# Patient Record
Sex: Female | Born: 1950
Health system: Southern US, Community
[De-identification: ages and names within clinical notes are randomized; demographics above are authoritative.]

## PROBLEM LIST (undated history)

## (undated) DIAGNOSIS — J302 Other seasonal allergic rhinitis: Secondary | ICD-10-CM

## (undated) DIAGNOSIS — M810 Age-related osteoporosis without current pathological fracture: Secondary | ICD-10-CM

## (undated) DIAGNOSIS — E785 Hyperlipidemia, unspecified: Secondary | ICD-10-CM

## (undated) DIAGNOSIS — N390 Urinary tract infection, site not specified: Secondary | ICD-10-CM

## (undated) DIAGNOSIS — I82409 Acute embolism and thrombosis of unspecified deep veins of unspecified lower extremity: Secondary | ICD-10-CM

## (undated) DIAGNOSIS — Z86718 Personal history of other venous thrombosis and embolism: Secondary | ICD-10-CM

## (undated) DIAGNOSIS — B019 Varicella without complication: Secondary | ICD-10-CM

## (undated) DIAGNOSIS — K219 Gastro-esophageal reflux disease without esophagitis: Secondary | ICD-10-CM

## (undated) DIAGNOSIS — M8580 Other specified disorders of bone density and structure, unspecified site: Secondary | ICD-10-CM

## (undated) DIAGNOSIS — I10 Essential (primary) hypertension: Secondary | ICD-10-CM

## (undated) DIAGNOSIS — E039 Hypothyroidism, unspecified: Secondary | ICD-10-CM

## (undated) HISTORY — DX: Hyperlipidemia, unspecified: E78.5

## (undated) HISTORY — DX: Varicella without complication: B01.9

## (undated) HISTORY — DX: Other seasonal allergic rhinitis: J30.2

## (undated) HISTORY — DX: Hypothyroidism, unspecified: E03.9

## (undated) HISTORY — DX: Age-related osteoporosis without current pathological fracture: M81.0

## (undated) HISTORY — DX: Urinary tract infection, site not specified: N39.0

## (undated) HISTORY — DX: Other specified disorders of bone density and structure, unspecified site: M85.80

## (undated) HISTORY — PX: COLONOSCOPY: SHX174

## (undated) HISTORY — DX: Personal history of other venous thrombosis and embolism: Z86.718

## (undated) HISTORY — DX: Gastro-esophageal reflux disease without esophagitis: K21.9

## (undated) HISTORY — PX: OTHER SURGICAL HISTORY: SHX169

## (undated) HISTORY — DX: Essential (primary) hypertension: I10

## (undated) HISTORY — DX: Acute embolism and thrombosis of unspecified deep veins of unspecified lower extremity: I82.409

---

## 1999-05-26 ENCOUNTER — Other Ambulatory Visit: Admission: RE | Admit: 1999-05-26 | Discharge: 1999-05-26 | Payer: Self-pay | Admitting: Gynecology

## 1999-05-26 ENCOUNTER — Encounter: Payer: Self-pay | Admitting: Gynecology

## 1999-05-26 ENCOUNTER — Encounter: Admission: RE | Admit: 1999-05-26 | Discharge: 1999-05-26 | Payer: Self-pay | Admitting: Gynecology

## 2000-06-08 ENCOUNTER — Encounter: Admission: RE | Admit: 2000-06-08 | Discharge: 2000-06-08 | Payer: Self-pay | Admitting: Gynecology

## 2000-06-08 ENCOUNTER — Encounter: Payer: Self-pay | Admitting: Gynecology

## 2000-07-12 ENCOUNTER — Other Ambulatory Visit: Admission: RE | Admit: 2000-07-12 | Discharge: 2000-07-12 | Payer: Self-pay | Admitting: Gynecology

## 2001-08-03 ENCOUNTER — Other Ambulatory Visit: Admission: RE | Admit: 2001-08-03 | Discharge: 2001-08-03 | Payer: Self-pay | Admitting: Gynecology

## 2001-12-04 ENCOUNTER — Encounter: Admission: RE | Admit: 2001-12-04 | Discharge: 2001-12-04 | Payer: Self-pay | Admitting: Gynecology

## 2001-12-04 ENCOUNTER — Encounter: Payer: Self-pay | Admitting: Gynecology

## 2003-12-12 ENCOUNTER — Encounter (INDEPENDENT_AMBULATORY_CARE_PROVIDER_SITE_OTHER): Payer: Self-pay | Admitting: *Deleted

## 2003-12-12 ENCOUNTER — Ambulatory Visit (HOSPITAL_COMMUNITY): Admission: RE | Admit: 2003-12-12 | Discharge: 2003-12-12 | Payer: Self-pay | Admitting: *Deleted

## 2003-12-16 ENCOUNTER — Other Ambulatory Visit: Admission: RE | Admit: 2003-12-16 | Discharge: 2003-12-16 | Payer: Self-pay | Admitting: Gynecology

## 2003-12-17 ENCOUNTER — Encounter: Admission: RE | Admit: 2003-12-17 | Discharge: 2003-12-17 | Payer: Self-pay | Admitting: Endocrinology

## 2004-12-29 ENCOUNTER — Encounter: Admission: RE | Admit: 2004-12-29 | Discharge: 2004-12-29 | Payer: Self-pay | Admitting: Family Medicine

## 2005-04-14 ENCOUNTER — Other Ambulatory Visit: Admission: RE | Admit: 2005-04-14 | Discharge: 2005-04-14 | Payer: Self-pay | Admitting: Gynecology

## 2006-01-06 ENCOUNTER — Encounter: Admission: RE | Admit: 2006-01-06 | Discharge: 2006-01-06 | Payer: Self-pay | Admitting: Family Medicine

## 2006-01-25 ENCOUNTER — Encounter: Admission: RE | Admit: 2006-01-25 | Discharge: 2006-01-25 | Payer: Self-pay | Admitting: Family Medicine

## 2006-05-11 ENCOUNTER — Other Ambulatory Visit: Admission: RE | Admit: 2006-05-11 | Discharge: 2006-05-11 | Payer: Self-pay | Admitting: Gynecology

## 2007-03-02 HISTORY — PX: BREAST BIOPSY: SHX20

## 2007-03-10 ENCOUNTER — Encounter: Admission: RE | Admit: 2007-03-10 | Discharge: 2007-03-10 | Payer: Self-pay | Admitting: Family Medicine

## 2007-03-15 ENCOUNTER — Encounter: Admission: RE | Admit: 2007-03-15 | Discharge: 2007-03-15 | Payer: Self-pay | Admitting: Family Medicine

## 2007-03-22 ENCOUNTER — Encounter: Admission: RE | Admit: 2007-03-22 | Discharge: 2007-03-22 | Payer: Self-pay | Admitting: Family Medicine

## 2007-05-22 ENCOUNTER — Encounter: Admission: RE | Admit: 2007-05-22 | Discharge: 2007-05-22 | Payer: Self-pay | Admitting: General Surgery

## 2007-05-24 ENCOUNTER — Ambulatory Visit (HOSPITAL_BASED_OUTPATIENT_CLINIC_OR_DEPARTMENT_OTHER): Admission: RE | Admit: 2007-05-24 | Discharge: 2007-05-24 | Payer: Self-pay | Admitting: General Surgery

## 2007-05-24 ENCOUNTER — Encounter (INDEPENDENT_AMBULATORY_CARE_PROVIDER_SITE_OTHER): Payer: Self-pay | Admitting: General Surgery

## 2007-05-24 ENCOUNTER — Encounter: Admission: RE | Admit: 2007-05-24 | Discharge: 2007-05-24 | Payer: Self-pay | Admitting: General Surgery

## 2007-05-24 HISTORY — PX: EXCISION OF BREAST BIOPSY: SHX5822

## 2008-06-18 ENCOUNTER — Encounter: Admission: RE | Admit: 2008-06-18 | Discharge: 2008-06-18 | Payer: Self-pay | Admitting: General Surgery

## 2008-12-14 ENCOUNTER — Encounter (INDEPENDENT_AMBULATORY_CARE_PROVIDER_SITE_OTHER): Payer: Self-pay | Admitting: *Deleted

## 2008-12-14 ENCOUNTER — Emergency Department (HOSPITAL_COMMUNITY): Admission: EM | Admit: 2008-12-14 | Discharge: 2008-12-14 | Payer: Self-pay | Admitting: Emergency Medicine

## 2008-12-19 ENCOUNTER — Encounter: Admission: RE | Admit: 2008-12-19 | Discharge: 2008-12-19 | Payer: Self-pay | Admitting: Otolaryngology

## 2009-03-14 ENCOUNTER — Ambulatory Visit: Payer: Self-pay | Admitting: Gastroenterology

## 2009-03-14 DIAGNOSIS — K219 Gastro-esophageal reflux disease without esophagitis: Secondary | ICD-10-CM | POA: Insufficient documentation

## 2009-03-14 LAB — CONVERTED CEMR LAB
Alcohol, Ethyl (B): <10 mg/dL (ref 0–10)
Eosinophils Relative: 0.5 % (ref 0.0–5.0)
Lymphocytes Relative: 24 % (ref 12.0–46.0)
Lymphs Abs: 2.3 10*3/uL (ref 0.7–4.0)
MCV: 97.7 fL (ref 78.0–100.0)
Monocytes Relative: 9.2 % (ref 3.0–12.0)
Neutrophils Relative %: 65.8 % (ref 43.0–77.0)

## 2009-03-17 LAB — CONVERTED CEMR LAB
ALT: 18 units/L (ref 0–35)
AST: 27 units/L (ref 0–37)
Albumin: 4.4 g/dL (ref 3.5–5.2)
Alkaline Phosphatase: 64 units/L (ref 39–117)
CO2: 21 meq/L (ref 19–32)
Chloride: 100 meq/L (ref 96–112)
Total Bilirubin: 0.3 mg/dL (ref 0.3–1.2)

## 2009-03-18 ENCOUNTER — Telehealth: Payer: Self-pay | Admitting: Gastroenterology

## 2009-03-27 ENCOUNTER — Ambulatory Visit (HOSPITAL_COMMUNITY): Admission: RE | Admit: 2009-03-27 | Discharge: 2009-03-27 | Payer: Self-pay | Admitting: Gastroenterology

## 2009-03-27 ENCOUNTER — Ambulatory Visit: Payer: Self-pay | Admitting: Gastroenterology

## 2009-07-17 ENCOUNTER — Encounter: Payer: Self-pay | Admitting: Nurse Practitioner

## 2009-07-31 ENCOUNTER — Telehealth: Payer: Self-pay | Admitting: Gastroenterology

## 2009-08-04 ENCOUNTER — Ambulatory Visit: Payer: Self-pay | Admitting: Internal Medicine

## 2009-08-04 DIAGNOSIS — R1011 Right upper quadrant pain: Secondary | ICD-10-CM

## 2009-08-04 DIAGNOSIS — R143 Flatulence: Secondary | ICD-10-CM

## 2009-08-04 DIAGNOSIS — R142 Eructation: Secondary | ICD-10-CM

## 2009-08-04 DIAGNOSIS — R1084 Generalized abdominal pain: Secondary | ICD-10-CM | POA: Insufficient documentation

## 2009-08-04 DIAGNOSIS — R110 Nausea: Secondary | ICD-10-CM

## 2009-08-04 DIAGNOSIS — R141 Gas pain: Secondary | ICD-10-CM | POA: Insufficient documentation

## 2009-08-12 ENCOUNTER — Ambulatory Visit (HOSPITAL_COMMUNITY): Admission: RE | Admit: 2009-08-12 | Discharge: 2009-08-12 | Payer: Self-pay | Admitting: Internal Medicine

## 2009-08-26 ENCOUNTER — Ambulatory Visit: Payer: Self-pay | Admitting: Gastroenterology

## 2009-08-27 ENCOUNTER — Ambulatory Visit: Payer: Self-pay | Admitting: Cardiology

## 2009-08-27 LAB — CONVERTED CEMR LAB
AST: 20 units/L (ref 0–37)
Amylase: 96 units/L (ref 27–131)
BUN: 15 mg/dL (ref 6–23)
CO2: 31 meq/L (ref 19–32)
Chloride: 105 meq/L (ref 96–112)
GFR calc non Af Amer: 98.95 mL/min (ref >60)
Lipase: 27 units/L (ref 11.0–59.0)
Potassium: 4.5 meq/L (ref 3.5–5.1)
Total Bilirubin: 0.4 mg/dL (ref 0.3–1.2)
Total Protein: 7.2 g/dL (ref 6.0–8.3)

## 2009-08-29 ENCOUNTER — Telehealth: Payer: Self-pay | Admitting: Gastroenterology

## 2009-08-29 ENCOUNTER — Encounter (INDEPENDENT_AMBULATORY_CARE_PROVIDER_SITE_OTHER): Payer: Self-pay | Admitting: *Deleted

## 2009-08-29 DIAGNOSIS — R933 Abnormal findings on diagnostic imaging of other parts of digestive tract: Secondary | ICD-10-CM

## 2009-09-04 ENCOUNTER — Ambulatory Visit: Payer: Self-pay | Admitting: Gastroenterology

## 2009-09-04 ENCOUNTER — Ambulatory Visit (HOSPITAL_COMMUNITY): Admission: RE | Admit: 2009-09-04 | Discharge: 2009-09-04 | Payer: Self-pay | Admitting: Gastroenterology

## 2009-09-10 ENCOUNTER — Telehealth: Payer: Self-pay | Admitting: Gastroenterology

## 2009-10-07 ENCOUNTER — Ambulatory Visit: Payer: Self-pay | Admitting: Gastroenterology

## 2009-10-07 ENCOUNTER — Encounter (INDEPENDENT_AMBULATORY_CARE_PROVIDER_SITE_OTHER): Payer: Self-pay | Admitting: *Deleted

## 2009-10-29 ENCOUNTER — Encounter: Payer: Self-pay | Admitting: Gastroenterology

## 2009-10-29 ENCOUNTER — Ambulatory Visit: Payer: Self-pay | Admitting: Gastroenterology

## 2009-12-19 ENCOUNTER — Telehealth: Payer: Self-pay | Admitting: Gastroenterology

## 2010-01-01 ENCOUNTER — Telehealth: Payer: Self-pay | Admitting: Gastroenterology

## 2010-01-02 ENCOUNTER — Encounter: Payer: Self-pay | Admitting: Gastroenterology

## 2010-02-04 ENCOUNTER — Encounter: Payer: Self-pay | Admitting: Gastroenterology

## 2010-02-06 ENCOUNTER — Ambulatory Visit: Payer: Self-pay | Admitting: Gastroenterology

## 2010-03-21 ENCOUNTER — Encounter: Payer: Self-pay | Admitting: Gynecology

## 2010-03-22 ENCOUNTER — Encounter: Payer: Self-pay | Admitting: Gynecology

## 2010-04-02 NOTE — Procedures (Signed)
Summary: Colon   Colonoscopy  Procedure date:  12/12/2003  Findings:      Location:  [facility].   NAME:  [name], STREAM                ACCOUNT NO.:  [id number]   MEDICAL RECORD NO.:  [id number]          PATIENT TYPE:  AMB   LOCATION:  ENDO                         FACILITY:  [facility]   PHYSICIAN:  Georgiana Spinner, M.D.    DATE OF BIRTH:  1951-02-12   DATE OF PROCEDURE:  12/12/2003  DATE OF DISCHARGE:                                 OPERATIVE REPORT   PROCEDURE PERFORMED:  Colonoscopy.   ENDOSCOPIST:  Georgiana Spinner, M.D.   INDICATIONS FOR PROCEDURE:  Colon cancer screening.   ANESTHESIA:  Demerol 30 mg, Versed 3 mg.   DESCRIPTION OF PROCEDURE:  With the patient mildly sedated in the left  lateral decubitus position, the Olympus videoscopic colonoscope was inserted  in the rectum and passed under direct vision to the cecum, identified by the  ileocecal valve and appendiceal orifice, both of which were photographed.  From this point the colonoscope was slowly withdrawn taking circumferential  views of the colonic mucosa stopping only in the rectum which appeared  normal on direct and showed hemorrhoids on retroflex view.  The endoscope  was straightened and withdrawn.  The patient's vital signs and pulse  oximeter remained stable.  The patient tolerated the procedure well without  apparent complications.   FINDINGS:  Unremarkable examination.   PLAN:  Consider repeat examination in five to 10 years.       GMO/MEDQ  D:  12/12/2003  T:  12/12/2003  Job:  [id number]

## 2010-04-02 NOTE — Procedures (Signed)
Summary: Upper Endoscopy  Patient: [name] Note: All result statuses are Final unless otherwise noted.  Tests: (1) Upper Endoscopy (EGD)   EGD Upper Endoscopy       DONE     [facility]     [address] [place], [place]  [address]           ENDOSCOPY PROCEDURE REPORT           PATIENT:  Kristine Strong, Kristine Strong  MR#:  [id number]     BIRTHDATE:  1950/09/20, 58 yrs. old  GENDER:  female           ENDOSCOPIST:  Rachael Fee, MD     Referred by:  Herb Grays, M.D.           PROCEDURE DATE:  03/27/2009     PROCEDURE:  EGD, diagnostic     ASA CLASS:  Class II     INDICATIONS:  GERD symptoms, chronically           MEDICATIONS:  MAC sedation, administered by CRNA     TOPICAL ANESTHETIC:  none           DESCRIPTION OF PROCEDURE:   After the risks benefits and     alternatives of the procedure were thoroughly explained, informed     consent was obtained.  The  endoscope was introduced through the     mouth and advanced to the second portion of the duodenum, without     limitations.  The instrument was slowly withdrawn as the mucosa     was fully examined.     <<PROCEDUREIMAGES>>           The upper, middle, and distal third of the esophagus were     carefully inspected and no abnormalities were noted. The z-line     was well seen at the GEJ. The endoscope was pushed into the fundus     which was normal including a retroflexed view. The antrum,gastric     body, first and second part of the duodenum were unremarkable (see     image1, image2, image3, image4, image5, image6, and image7).     Retroflexed views revealed no abnormalities.    The scope was then     withdrawn from the patient and the procedure completed.     COMPLICATIONS:  None           ENDOSCOPIC IMPRESSION:     1) Normal EGD; no esophagitis, no Barrett's, no gastritis.     2) She has non-erosive GERD.  Life style contributes (alcohol,     smoking cigarettes, caffeine, laying down to sleep soon after  eating).  She is working on these issues and has noticed some     improvement already.           RECOMMENDATIONS:     Continue to cut back or stop completely on alcohol, caffeine,     smoking, laying down soon after eating.  All of these can worsen     acid reflux.     Continue once daily prilosec (20-30 min prior to breakfast or     dinner meal).           ______________________________     Rachael Fee, MD           n.     eSIGNED:   Rachael Fee at 03/27/2009 08:18 AM           Darra Lis,  [id number]  Note: An exclamation mark (!) indicates a result that was not dispersed into the flowsheet. Document Creation Date: 03/27/2009 8:18 AM _______________________________________________________________________  (1) Order result status: Final Collection or observation date-time: 03/27/2009 08:10 Requested date-time:  Receipt date-time:  Reported date-time:  Referring Physician:   Ordering Physician: Rob Bunting [phone]) Specimen Source:  Source: Launa Grill Order Number: [id number] Lab site:

## 2010-04-02 NOTE — Progress Notes (Signed)
Summary: Triage  Phone Note Call from Patient Call back at Home Phone [phone]   Caller: Patient Call For: Dr. Christella Hartigan Reason for Call: Talk to Nurse Summary of Call: pt. has an appt on 08-26-09 and requesting a sooner appt. Is having alot of abd. pain and discomfort, some nausea Initial call taken by: Karna Christmas,  July 31, 2009 2:37 PM  Follow-up for Phone Call        pt having increased pain and change in bowel habits.  appt made with Gunnar Fusi for today, Follow-up by: Chales Abrahams CMA Duncan Dull),  August 04, 2009 8:45 AM

## 2010-04-02 NOTE — Assessment & Plan Note (Signed)
Review of gastrointestinal problems: 1. Nonerosive GERD.  EGD January 2011 was normal. Significant lifestyle issues contributing to her chronic GERD symptoms including chronic alcohol abuse, cigarette smoking, high caffeine intake, laying down soon before going to bed. 2. Dyspepsia, GERD related?  ultrasound 2010 showed normal gallbladder. HIDA scan 2011 showed normal gallbladder ejection fraction.  CT Scan June, 2011 suggested thick gastric wall, incidental small bowel intussusception. Followup endoscopic ultrasound July, 2011 showed thick gastric folds, biopsies were not consistent with menetrier's disease.  Dyspeptic symptoms worse on twice daily PPI.    History of Present Illness Visit Type: Follow-up Visit Primary GI MD: Rob Bunting MD Primary Provider: Herb Grays, MD  Requesting Provider: Herb Grays, MD  Chief Complaint: follow-up  History of Present Illness:     her nexium was doubled after EUS; her eye and scalp was itching also had raw feeling in stomach.  She cut back to one a day (eyes stopped intching and stomach feels less raw).  I put her on nexium one a day.  she stopped maalox, stopped pepto, tums.  Several months ago she saw an ENT MD and was told she had terrible reflux, tried her on high dose H2 and PPI.  also having scibbolus stools, started in past few months.  No blood in stool.  she is always hungry, sore in low abdomen in middle of night.  Umbilicus can hurt at times.           Current Medications (verified): 1)  Synthroid 125 Mcg Tabs (Levothyroxine Sodium) .Marland Kitchen.. 1 By Mouth Once Daily 2)  Vitamin D 400 Unit Caps (Cholecalciferol) .... 2  By Mouth At Bedtime 3)  Losartan Potassium 50 Mg Tabs (Losartan Potassium) .Marland Kitchen.. 1 By Mouth Once Daily 4)  Calcium Plus Vitamin D 500-50 Mg-Unit Caps (Calcium Carbonate-Vitamin D) .Marland Kitchen.. 1 By Mouth Once Daily 5)  Nexium 40 Mg Cpdr (Esomeprazole Magnesium) .Marland Kitchen.. 1 By Mouth Once Daily 6)  Zofran 4 Mg Tabs (Ondansetron Hcl)  .Marland Kitchen.. 1 Tab By Mouth Q Am As Needed 7)  Fish Oil 1000 Mg Caps (Omega-3 Fatty Acids) .Marland Kitchen.. 1 By Mouth At Bedtime  Allergies (verified): No Known Drug Allergies  Vital Signs:  Patient profile:   60 year old female Height:      64 inches Weight:      131.38 pounds BMI:     22.63 Pulse rate:   84 / minute Pulse rhythm:   regular BP sitting:   110 / 74  (left arm)  Vitals Entered By: Milford Cage NCMA (October 07, 2009 10:19 AM)  Physical Exam  Additional Exam:  Constitutional: generally well appearing Psychiatric: alert and oriented times 3 Abdomen: soft, non-tender, non-distended, normal bowel sounds    Impression & Recommendations:  Problem # 1:  dyspepsia, change in bowels, constipation she has had myriad GI testing and I think after all of this I can only comfortably say that she likely has GERD. There was an intussusception noted on CAT scan however her chronic constant discomforts are not consistent with that, I suspect it was simply incidental. She has had a change in her bowels over the past several months noting some scibbolus stools, constipation. She had a colonoscopy by Dr. Virginia Rochester 6 years ago and that was normal. With her new change in bowel habits and her continued GI complaints I think we should repeat her colonoscopy. We will set her up for that. I will also put her on fiber supplements.  Patient Instructions: 1)  You  should begin taking citrucel powder fiber supplement (orange flavor).  Start with a small spoonful and increase this over 1 week to a full, heaping spoonful daily.  You may notice some bloating when you first start the fiber, but that usually resolves after a few days. 2)  Stay on nexium once a day for now. 3)  You will be scheduled to have a colonoscopy. 4)  A copy of this information will be sent to Dr. Collins Scotland. 5)  The medication list was reviewed and reconciled.  All changed / newly prescribed medications were explained.  A complete medication list was  provided to the patient / caregiver.  Appended Document: Orders Update/movi    Clinical Lists Changes  Orders: Added new Test order of Colonoscopy (Colon) - Signed      Appended Document: Movi    Clinical Lists Changes  Medications: Added new medication of MOVIPREP 100 GM  SOLR (PEG-KCL-NACL-NASULF-NA ASC-C) As per prep instructions. - Signed Rx of MOVIPREP 100 GM  SOLR (PEG-KCL-NACL-NASULF-NA ASC-C) As per prep instructions.;  #1 x 0;  Signed;  Entered by: Chales Abrahams CMA (AAMA);  Authorized by: Rachael Fee MD;  Method used: Electronically to [facility]  [place] [address] [place], [place], [place]  [address], Ph: [phone] or [phone], Fax: [phone]    Prescriptions: MOVIPREP 100 GM  SOLR (PEG-KCL-NACL-NASULF-NA ASC-C) As per prep instructions.  #1 x 0   Entered by:   Chales Abrahams CMA (AAMA)   Authorized by:   Rachael Fee MD   Signed by:   Chales Abrahams CMA (AAMA) on 10/07/2009   Method used:   Electronically to        [facility]  [place] [address] (retail)       [address]       [place], [place]  [address]       Ph: [phone] or [phone]       Fax: [phone]   RxID:   [id number]

## 2010-04-02 NOTE — Progress Notes (Signed)
Summary: Nexium concern  Phone Note Call from Patient Call back at Home Phone [phone]   Caller: Patient Call For: Dr. Christella Hartigan Reason for Call: Talk to Nurse Summary of Call: questions re Nexium rx (FYI: pt also sch'ed for next available with Dr. Christella Hartigan and is on waitlist) Initial call taken by: Vallarie Mare,  January 01, 2010 9:29 AM  Follow-up for Phone Call        prior auth requested form faxed to [facility] Follow-up by: Chales Abrahams CMA Duncan Dull),  January 01, 2010 10:16 AM

## 2010-04-02 NOTE — Letter (Signed)
Summary: EGD Instructions  [facility]  [address] [place], [place] [address]   Phone: [phone]  Fax: [phone]       Kristine Strong    08-21-1950    MRN: [id number]       Procedure Day /Date:03/27/09     Arrival Time: 6:00 am     Procedure Time:8:00 am     Location of Procedure:                     [facility] ( Outpatient Registration)   PREPARATION FOR ENDOSCOPY   On 03/27/09 THE DAY OF THE PROCEDURE:  1.   No solid foods, milk or milk products are allowed after midnight the night before your procedure.  2.   Do not drink anything colored red or purple.  Avoid juices with pulp.  No orange juice.  3.  You may drink clear liquids until 4 am, which is 4 hours before your procedure.                                                                                                CLEAR LIQUIDS INCLUDE: Water Jello Ice Popsicles Tea (sugar ok, no milk/cream) Powdered fruit flavored drinks Coffee (sugar ok, no milk/cream) Gatorade Juice: apple, white grape, white cranberry  Lemonade Clear bullion, consomm, broth Carbonated beverages (any kind) Strained chicken noodle soup Hard Candy   MEDICATION INSTRUCTIONS  Unless otherwise instructed, you should take regular prescription medications with a small sip of water as early as possible the morning of your procedure.             OTHER INSTRUCTIONS  You will need a responsible adult at least 61 years of age to accompany you and drive you home.   This person must remain in the waiting room during your procedure.  Wear loose fitting clothing that is easily removed.  Leave jewelry and other valuables at home.  However, you may wish to bring a book to read or an iPod/MP3 player to listen to music as you wait for your procedure to start.  Remove all body piercing jewelry and leave at home.  Total time from sign-in until discharge is approximately 2-3 hours.  You should go home directly  after your procedure and rest.  You can resume normal activities the day after your procedure.  The day of your procedure you should not:   Drive   Make legal decisions   Operate machinery   Drink alcohol   Return to work  You will receive specific instructions about eating, activities and medications before you leave.    The above instructions have been reviewed and explained to me by   _______________________    I fully understand and can verbalize these instructions _____________________________ Date _________

## 2010-04-02 NOTE — Progress Notes (Signed)
Summary: Increased reflux.   Phone Note Call from Patient Call back at Home Phone [phone] Call back at [phone]   Caller: Patient Summary of Call: patient complains of inceased reflux that is waking her from her sleep and she is taking tums at night. She is having a hard time taking just one tablet of omeprazole a day she is needing to take maylox and alot of tims to make up for not taking more omeprazole. She is having RLQ pain as well, that comes and goes. She does have her ZEGD scheduled for 1/27/2011at the hospital but wants to know what to do until her EGD. She also would be interested in moving her ZEGD up to this thursday if possible.  Initial call taken by: Harlow Mares CMA Duncan Dull),  March 18, 2009 11:07 AM  Follow-up for Phone Call        Pt has quit drinking alcohol, caffiene and quit smoking but she has been eating very acidic foods.  Pt will watch her diet and keep her appt with Dr Christella Hartigan for her EGD. Follow-up by: Chales Abrahams CMA Duncan Dull),  March 18, 2009 11:25 AM

## 2010-04-02 NOTE — Progress Notes (Signed)
Summary: rx concern  Phone Note Call from Patient Call back at Home Phone [phone]   Caller: Patient Call For: Dr. Christella Hartigan Reason for Call: Talk to Nurse Summary of Call: needs Nexium to now be sent in via mail order... [facility], fax #[phone] and prior auth phone #[phone]... needs to be 180 pill count Initial call taken by: Vallarie Mare,  December 19, 2009 3:31 PM  Follow-up for Phone Call        pt aware Follow-up by: Chales Abrahams CMA Duncan Dull),  December 19, 2009 3:39 PM    New/Updated Medications: NEXIUM 40 MG CPDR (ESOMEPRAZOLE MAGNESIUM) 1 by mouth two times a day Prescriptions: NEXIUM 40 MG CPDR (ESOMEPRAZOLE MAGNESIUM) 1 by mouth two times a day  #180 x 3   Entered by:   Chales Abrahams CMA (AAMA)   Authorized by:   Rachael Fee MD   Signed by:   Chales Abrahams CMA (AAMA) on 12/19/2009   Method used:   Faxed to ...       [facility] [place] (mail-order)             , [place]         Ph: [phone]       Fax: [phone]   RxID:   [id number]   Appended Document: rx concern samples left at front desk

## 2010-04-02 NOTE — Procedures (Signed)
Summary: Colonoscopy  Patient: Kristine Strong Note: All result statuses are Final unless otherwise noted.  Tests: (1) Colonoscopy (COL)   COL Colonoscopy           DONE     [facility]     [address]     [place], [place]  [address]           COLONOSCOPY PROCEDURE REPORT           PATIENT:  [name], [name]  MR#:  [id number]     BIRTHDATE:  [birth year], 58 yrs. old  GENDER:  female     ENDOSCOPIST:  Rachael Fee, MD     PROCEDURE DATE:  10/29/2009     PROCEDURE:  Diagnostic Colonoscopy     ASA CLASS:  Class II     INDICATIONS:  lower abd pain, abnormal stools (scibbolous,     constipated)     MEDICATIONS:   Fentanyl 100 mcg IV, Versed 8 mg IV           DESCRIPTION OF PROCEDURE:   After the risks benefits and     alternatives of the procedure were thoroughly explained, informed     consent was obtained.  No rectal exam performed. The LB CF-H180AL     [id number] endoscope was introduced through the anus and advanced to     the terminal ileum which was intubated for a short distance,     without limitations.  The quality of the prep was good, using     MoviPrep.  The instrument was then slowly withdrawn as the colon     was fully examined.     <<PROCEDUREIMAGES>>     FINDINGS:  Mild diverticulosis was found in the sigmoid to     descending colon segments (see image1).  The terminal ileum     appeared normal (see image4).  This was otherwise a normal     examination of the colon (see image2, image5, and image6).     Retroflexed views in the rectum revealed no abnormalities.    The     scope was then withdrawn from the patient and the procedure     completed.     COMPLICATIONS:  None     ENDOSCOPIC IMPRESSION:     1) Mild diverticulosis in the sigmoid to descending colon     segments     2) Normal terminal ileum     3) Otherwise normal examination     RECOMMENDATIONS:     1) Continue current colorectal screening recommendations for     "routine risk" patients with a  repeat colonoscopy in 10 years.     2) Restart citrucel, continue this daily to improve     constipation/bowel habits           REPEAT EXAM:  10 years           ______________________________     Rachael Fee, MD           cc: Herb Grays, MD           n.     Rosalie Doctor:   Rachael Fee at 10/29/2009 02:50 PM           Darra Lis, [id number]  Note: An exclamation mark (!) indicates a result that was not dispersed into the flowsheet. Document Creation Date: 10/29/2009 2:51 PM _______________________________________________________________________  (1) Order result status: Final Collection or observation date-time: 10/29/2009 14:42 Requested date-time:  Receipt date-time:  Reported  date-time:  Referring Physician:   Ordering Physician: Rob Bunting [phone]) Specimen Source:  Source: Launa Grill Order Number: [id number] Lab site:   Appended Document: Colonoscopy    Clinical Lists Changes  Observations: Added new observation of COLONNXTDUE: 09/2019 (10/29/2009 16:02)

## 2010-04-02 NOTE — Procedures (Signed)
Summary: Endoscopic Ultrasound  Patient: Kristine Strong Note: All result statuses are Final unless otherwise noted.  Tests: (1) Endoscopic Ultrasound (EUS)  EUS Endoscopic Ultrasound                             DONE     [facility]     [address] [place], [place]  [address]           ENDOSCOPIC ULTRASOUND PROCEDURE REPORT           PATIENT:  Kristine Strong  MR#:  [id number]     BIRTHDATE:  07-10-1950  GENDER:  female     ENDOSCOPIST:  Rachael Fee, MD     PROCEDURE DATE:  09/04/2009     PROCEDURE:  Upper EUS     ASA CLASS:  Class II     INDICATIONS:  epigastric discomfort, burning; recent CT suggested     thickened gastric wall           MEDICATIONS:  MAC sedation, administered by CRNA           DESCRIPTION OF PROCEDURE:   After the risks benefits and     alternatives of the procedure were  explained, informed consent     was obtained. The patient was then placed in the left, lateral,     decubitus postion and IV sedation was administered. Throughout the     procedure, the patient's blood pressure, pulse and oxygen     saturations were monitored continuously.  Under direct     visualization, the  endoscope was introduced through the mouth and     advanced to the duodenum.  Water was used as necessary to provide     an acoustic interface.  Upon completion of the imaging, water was     removed and the patient was sent to the recovery room in     satisfactory condition.           <<PROCEDUREIMAGES>>           Endoscopic views (with radial echoendoscope and standard adult     gastroscope):     1. Large, deep ruggal folds.  Otherwise normal stomach. Following     EUS examination the folds were biopsied, sent to pathology.           EUS findings:     1. The gastric wall is NOT thickened, rather the gastric ruggae     are edematous and deeper than usual.     2. No perigastric adenopathy.     3. Pancreatic parenchyma was normal; no signs of chronic  pancreatitis or pancreatic masses.     4. Normal main pancreatic duct.           Impression:     Normal gastric wall thickness. The CT scan finding likely is due     to the deeper than usual, edematous gastric folds. Biopsies were     taken to check for Menetrier's disease (a benign hyperplasia of     gastric folds).  No sign of chronic pancreatitis.  Await final     biopsies.           ______________________________     Rachael Fee, MD           cc: Kristine Grays, MD           n.     eSIGNED:  Rachael Fee at 09/04/2009 02:30 PM           Kristine Strong, [id number]  Note: An exclamation mark (!) indicates a result that was not dispersed into the flowsheet. Document Creation Date: 09/04/2009 2:31 PM _______________________________________________________________________  (1) Order result status: Final Collection or observation date-time: 09/04/2009 14:21 Requested date-time:  Receipt date-time:  Reported date-time:  Referring Physician:   Ordering Physician: Rob Bunting [phone]) Specimen Source:  Source: Launa Grill Order Number: [id number] Lab site:   Appended Document: Endoscopic Ultrasound patty, please call her, the pathology showed no sign of Menetrier's disease.  I want her to change to prescription PPI (nexium one pill, twice daily, disp 60 with 1 refill) and she needs rov with me in 4-5 weeks.  Appended Document: Endoscopic Ultrasound pt aware and nexium sent to pharmacy will call back to schedule ROV  Appended Document: Endoscopic Ultrasound ROV scheduled

## 2010-04-02 NOTE — Assessment & Plan Note (Signed)
Review of gastrointestinal problems: 1. Nonerosive GERD.  EGD January 2011 was normal. Significant lifestyle issues contributing to her chronic GERD symptoms including chronic alcohol abuse, cigarette smoking, high caffeine intake, laying down soon before going to bed. 2. Dyspepsia, GERD related?  ultrasound 2010 showed normal gallbladder. HIDA scan 2011 showed normal gallbladder ejection fraction.  CT Scan June, 2011 suggested thick gastric wall, incidental small bowel intussusception. Followup endoscopic ultrasound July, 2011 showed thick gastric folds, biopsies were not consistent with menetrier's disease.  Dyspeptic symptoms worse on twice daily PPI.  Sept 2011 celiac sprue testing all negative serologically. 3. Routine risk for colon cancer. Colonoscopy Aug 2011 diverticulosis only, normal TI.  NO polyps, recall colonoscopy 10 year interval.   History of Present Illness Visit Type: Follow-up Visit Primary GI MD: Rob Bunting MD Primary Provider: Herb Grays, MD  Requesting Provider: Herb Grays, MD  Chief Complaint: Rt. sided pain & nausea History of Present Illness:     pleasant 60 year old woman who I last saw several months ago. She has chronic GI symptoms with an exhaustive GI workup,see the  results summarized above. She still has nausea "sick feeling".  She never had vomiting.  some days are fine, but then feels bad again. Can interupt her sleep.  She also has discomforts in ruq.  She is taking twice daily PPI.  up two pounds in past 6 months         Anticoagulation Management Assessment/Plan:               Current Medications (verified): 1)  Synthroid 125 Mcg Tabs (Levothyroxine Sodium) .Marland Kitchen.. 1 By Mouth Once Daily 2)  Calcium Citrate 500mg  Vitamin D 800mg  Tablets .... Take 1 Tablet  Every Morning & 1 1/2 Every Evening 3)  Losartan Potassium 100 Mg Tabs (Losartan Potassium) .... Once Daily 4)  Calcium Plus Vitamin D 500-50 Mg-Unit Caps (Calcium Carbonate-Vitamin D)  .Marland Kitchen.. 1 By Mouth Once Daily 5)  Nexium 40 Mg Cpdr (Esomeprazole Magnesium) .Marland Kitchen.. 1 By Mouth Two Times A Day 6)  Citrucel  Powd (Methylcellulose (Laxative)) .... As Directed Every Evening  Allergies (verified): No Known Drug Allergies  Social History: quite smoking rare caffeine rare etoh  Vital Signs:  Patient profile:   60 year old female Height:      64 inches Weight:      133.38 pounds BMI:     22.98 Pulse rate:   72 / minute Pulse rhythm:   regular BP sitting:   136 / 80  (left arm) Cuff size:   regular  Vitals Entered By: June McMurray CMA Duncan Dull) (February 06, 2010 9:45 AM)  Physical Exam  Additional Exam:  Constitutional: generally well appearing Psychiatric: alert and oriented times 3 Abdomen: soft, non-tender, non-distended, normal bowel sounds     Assessment and planShe has chronic nausea and a right upper quadrant discomfort and has undergone exhaustive GI workup. I have no clear explanation for her symptoms I think simply treating her in. He was antinausea medicines is reasonable for now. I will also add Carafate to her regimen. I think is fine that she decrease to over-the-counter proton pump inhibitor since Nexium does not seem to be making much of a difference even at twice daily dosing and it costs her quite a lot.   Patient Instructions: 1)  Change back to once daily PPI (omeprazole works very well, best 20-30 min before meal). 2)  Avoid NSAIDs as best as possible. 3)  Trial of carafate liquid, twice a  day. 4)  Call Dr. Christella Hartigan' office in 5-6 weeks to report on your symptoms. 5)  A copy of this information will be sent to Dr. Collins Scotland. 6)  The medication list was reviewed and reconciled.  All changed / newly prescribed medications were explained.  A complete medication list was provided to the patient / caregiver. Prescriptions: SUCRALFATE 1 GM/10ML SUSP (SUCRALFATE) take one gram slurry, twice daily  #1 month x 3   Entered by:   Chales Abrahams CMA (AAMA)    Authorized by:   Rachael Fee MD   Signed by:   Chales Abrahams CMA (AAMA) on 02/06/2010   Method used:   Electronically to        [facility]  [place] [address] (retail[facility]       [address] [place] [address]       [place], [place]  [address]       Ph: [phone] or [phone]       Fax: [phone]   RxID:   [id number] PROMETHAZINE HCL 25 MG  TABS (PROMETHAZINE HCL) take one pill one daily as needed  #30 x 3   Entered and Authorized by:   Rachael Fee MD   Signed by:   Rachael Fee MD on 02/06/2010   Method used:   [facility]        [facility]  [place] [address]       [address]       [place], [place]  [address]       Ph: [phone] or [phone]       Fax: [phone]   RxID:   [id number] SUCRALFATE 1 GM/10ML SUSP (SUCRALFATE) take one gram slurry, twice daily  #1 month x 3   Entered and Authorized by:   Rachael Fee MD   Signed by:   Rachael Fee MD on 02/06/2010   Method used:   Historical   RxID:   [id number]

## 2010-04-02 NOTE — Medication Information (Signed)
Summary: Approved/medco  Approved/medco   Imported By: Lester Spindale 01/07/2010 09:38:12  _____________________________________________________________________  External Attachment:    Type:   Image     Comment:   External Document

## 2010-04-02 NOTE — Assessment & Plan Note (Signed)
History of Present Illness Visit Type: Initial Consult Primary GI MD: Rob Bunting MD Primary Provider: Valli Glance Requesting Provider: Valli Glance Chief Complaint: Pt c/o throat pain History of Present Illness:      60 year old woman with multiple complaints.   who 5 months ago had left ear burning, pain.  Saw several MDs (MRI, CT scan neck and ears, has been to dentists, ENT).   The ear discomfort is still unexplained.  Most recent ENT evaluation told her she may have TMJ.  She has a burning, packed sensation in her left ear.    Last week she was given a z pack for bronchitits type symptoms.  She has also had nausea.  Was told she had "severe reflux" by PCP and ENT.  She has mild, intermittent pill associated dysphagia.    she does describe some pyrosis and acid taste in her mouth.  she has lost 10 pounds in 3-4 weeks.    she has neck pains, dizziness in the past several weeks.  she is having "panic attacks" , breathing difficultly.  she drinks 5-6 beers a day and 4-5 cups of coffee a day (at least, usually more although she stopped drinking alcohol last week.).  she was in the emergency room this past October with abdominal pains. I see an ultrasound report showing normal right upper quadrant ultrasound examination.  She had a colonoscopy with Dr. Greggory Stallion or in 2005 and it was normal. She had upper endoscopy with Dr. Greggory Stallion or in 2005 as well and it was also normal.            Current Medications (verified): 1)  Benicar 40 Mg Tabs (Olmesartan Medoxomil) .Marland Kitchen.. 1 By Mouth Once Daily 2)  Caltrate 600+d 600-400 Mg-Unit Tabs (Calcium Carbonate-Vitamin D) .Marland Kitchen.. 1 By Mouth Q Am, 1 1/2 By Mouth Q Pm 3)  Prilosec 40 Mg Cpdr (Omeprazole) .Marland Kitchen.. 1 By Mouth Once Daily 4)  Synthroid 125 Mcg Tabs (Levothyroxine Sodium) .Marland Kitchen.. 1 By Mouth Once Daily 5)  Vitamin D 400 Unit Caps (Cholecalciferol) .Marland Kitchen.. 1 By Mouth At Bedtime 6)  Zantac 300 Mg Tabs (Ranitidine Hcl) .Marland Kitchen.. 1 By Mouth Once  Daily 7)  Promethazine Hcl 25 Mg Tabs (Promethazine Hcl) .... As Needed 8)  Benicar 20 Mg Tabs (Olmesartan Medoxomil) .Marland Kitchen.. 1 By Mouth Once Daily 9)  Chlorzoxazone 500 Mg Tabs (Chlorzoxazone) .Marland Kitchen.. 1 By Mouth Two Times A Day  Allergies (verified): No Known Drug Allergies  Past History:  Past Medical History: Hyperlipidemia Hypertension Hyperthyroidism Osteopenia GERD  Past Surgical History: C-section 20  Family History: no esophagus or colon cancer  Social History: she is married, she has one daughter, she does not work, until the past one week she smokes a pack of cigarettes a day and drinks 5-6 beers a day. She still drinks 4-5 cups of coffee a day but used to drink a pot of coffee a day  Review of Systems       Pertinent positive and negative review of systems were noted in the above HPI and GI specific review of systems.  All other review of systems was otherwise negative.   Vital Signs:  Patient profile:   60 year old female Height:      64 inches Weight:      126 pounds BMI:     21.71 BSA:     1.61 Pulse rate:   100 / minute Pulse rhythm:   regular BP sitting:   124 / 78  (left arm)  Vitals Entered By: Merri Ray CMA Duncan Dull) (March 14, 2009 1:33 PM)  Physical Exam  Additional Exam:  Constitutional: generally well appearing Psychiatric: alert and oriented times 3 Eyes: extraocular movements intact Mouth: oropharynx moist, no lesions Neck: supple, no lymphadenopathy Cardiovascular: heart regular rate and rythm Lungs: CTA bilaterally Abdomen: soft, Mildly tender throughout, non-distended, no obvious ascites, no peritoneal signs, normal bowel sounds Extremities: no lower extremity edema bilaterally Skin: no lesions on visible extremities    Impression & Recommendations:  Problem # 1:  pyrosis, intermittent dysphasia I do think that she has an issue with GERD. Much of this could be do to her lifestyle, habits. She smoke cigarettes, drinks at least  5-6 beers a day, drinks up to a pot of coffee a day, often will eat a meal and lay down for bed shortly afterwards. I explained to her that all these habits we know will make acid symptoms worse. I have given her a GERD handout to reiterate this. I urged her to try to cut back on all of these habits. She will continue taking Prilosec although it should be 20-30 minutes prior to a meal as that is the best time in relation to food. I think EGD is reasonable given the worsening of her symptoms lately, dysphasia, weight loss. We will schedule that to be done at [facility] with propofol sedation given her obvious anxiety.  Problem # 2:  neck pains, ear burning, dizziness I explained to her that none of these symptoms are likely related to her GI tract.  Other Orders: TLB-CBC Platelet - w/Differential (85025-CBCD) TLB-CMP (Comprehensive Metabolic Pnl) (80053-COMP) TLB-Sedimentation Rate (ESR) (85652-ESR) T-Alcohol [facility]) (82055-ALC)  Patient Instructions: 1)  You will be scheduled to have an upper endoscopy (at [facility] with propofol). 2)  You should change the way you are taking your antiacid medicine (prilosec) so that you are taking it 20-30 minutes prior to a decent  breakfast  or dinner meal as that is the way the pill is designed to work most effectively. 3)  Alcohol and caffein make GERD (acid symptoms) much worse.  You should but back or stop completely as best that you can. 4)  GERD handout. 5)  You will get lab test(s) done today (cbc, bmet, esr). 6)  A copy of this information will be sent to Dr Collins Scotland. 7)  The medication list was reviewed and reconciled.  All changed / newly prescribed medications were explained.  A complete medication list was provided to the patient / caregiver.  Appended Document: Orders Update/EGD    Clinical Lists Changes  Orders: Added new Test order of ZEGD (ZEGD) - Signed      Appended Document: Orders Update    Clinical Lists  Changes  Orders: Added new Test order of T- * Misc. Laboratory test [phone]) - Signed

## 2010-04-02 NOTE — Miscellaneous (Signed)
Summary: lab orders  Clinical Lists Changes  Orders: Added new Test order of T-Tissue Transglutamase Ab IgA [phone]) - Signed Added new Test order of TLB-IgA (Immunoglobulin A) (82784-IGA) - Signed

## 2010-04-02 NOTE — Letter (Signed)
Summary: EGD Instructions  [facility]  [address] [place], [place] [address]   Phone: [phone]  Fax: [phone]       Kristine Strong    1950/05/12    MRN: [id number]       Procedure Day /Date:09/04/09    Arrival Time12:45 pm _     Procedure Time:1:45 p.     Location of Procedure:                    _  [facility] ([facility] _ X _ [facility] ( Outpatient Registration) _  _ [facility] (Short Stay on [address]   PREPARATION FOR ENDOSCOPY/EUS   OnThursday THE DAY OF THE PROCEDURE:1.   No solid foods, milk or milk products are allowed after midnight the night before your procedure.  .                                                                                                            Additional medication instructions: May take thyroid med. with a sip of water no later than 8 a.m.            OTHER INSTRUCTIONS  You will need a responsible adult at least 61 years of age to accompany you and drive you home.   This person must remain in the waiting room during your procedure.  Wear loose fitting clothing that is easily removed.  Leave jewelry and other valuables at home.  However, you may wish to bring a book to read or an iPod/MP3 player to listen to music as you wait for your procedure to start.  Remove all body piercing jewelry and leave at home.  Total time from sign-in until discharge is approximately 2-3 hours.  You should go home directly after your procedure and rest.  You can resume normal activities the day after your procedure.  The day of your procedure you should not:   Drive   Make legal decisions   Operate machinery   Drink alcohol   Return to work  You will receive specific instructions about eating, activities and medications before you leave.    The above instructions have been reviewed andexplained  by   [facility] phone and mailed to   pt.____________________________ Date _7/1/11________

## 2010-04-02 NOTE — Procedures (Signed)
Summary: EGD   Colonoscopy  Procedure date:  12/12/2003  Findings:      Location:  [facility].   NAME:  Kristine Strong, Kristine Strong                ACCOUNT NO.:  [id number]   MEDICAL RECORD NO.:  [id number]          PATIENT TYPE:  AMB   LOCATION:  ENDO                         FACILITY:  [facility]   PHYSICIAN:  Georgiana Spinner, M.D.    DATE OF BIRTH:  04-18-1950   DATE OF PROCEDURE:  12/12/2003  DATE OF DISCHARGE:                                 OPERATIVE REPORT   PROCEDURE PERFORMED:  Upper endoscopy.   ENDOSCOPIST:  Georgiana Spinner, M.D.   INDICATIONS FOR PROCEDURE:  Gastroesophageal reflux disease.   ANESTHESIA:  Demerol 70 mg, Versed 7 mg.   DESCRIPTION OF PROCEDURE:  With the patient mildly sedated in the left  lateral decubitus position, the Olympus videoscopic endoscope was inserted  in the mouth and passed under direct vision through the esophagus which  appeared normal into the stomach.  The fundus, body, antrum, duodenal bulb  and second portion of the duodenum all appeared normal.  From this point,  the endoscope was slowly withdrawn taking circumferential views of the  entire duodenal mucosa until the endoscope was pulled back into the stomach  and placed on retroflexion to view the stomach from below.  The endoscope  was then straightened and withdrawn taking circumferential views of the  remaining gastric and esophageal mucosa.  The patient's vital signs and  pulse oximeter remained stable.  The patient tolerated the procedure well  without apparent complications.   FINDINGS:  Unremarkable examination.   PLAN:  Proceed to colonoscopy.       GMO/MEDQ  D:  12/12/2003  T:  12/12/2003  Job:  [id number]

## 2010-04-02 NOTE — Procedures (Signed)
Summary: Prep/[facility]  Prep/ Gastroenterology   Imported By: Lester Westmere 03/29/2009 09:42:46  _____________________________________________________________________  External Attachment:    Type:   Image     Comment:   External Document

## 2010-04-02 NOTE — Letter (Signed)
Summary: [facility] Instructions  [facility]  [address] [place], [place] [address]   Phone: [phone]  Fax: [phone]       Kristine Strong    1950/08/19    MRN: [id number]        Procedure Day /Date:10/29/09 WED     Arrival Time:130 pm       Procedure Time:230 pm     Location of Procedure:                    X  [facility] (4th Floor)   PREPARATION FOR COLONOSCOPY WITH MOVIPREP   Starting 5 days prior to your procedure 8/26/11do not eat nuts, seeds, popcorn, corn, beans, peas,  salads, or any raw vegetables.  Do not take any fiber supplements (e.g. Metamucil, Citrucel, and Benefiber).  THE DAY BEFORE YOUR PROCEDURE         DATE: 10/28/09  DAY: TUE  1.  Drink clear liquids the entire day-NO SOLID FOOD  2.  Do not drink anything colored red or purple.  Avoid juices with pulp.  No orange juice.  3.  Drink at least 64 oz. (8 glasses) of fluid/clear liquids during the day to prevent dehydration and help the prep work efficiently.  CLEAR LIQUIDS INCLUDE: Water Jello Ice Popsicles Tea (sugar ok, no milk/cream) Powdered fruit flavored drinks Coffee (sugar ok, no milk/cream) Gatorade Juice: apple, white grape, white cranberry  Lemonade Clear bullion, consomm, broth Carbonated beverages (any kind) Strained chicken noodle soup Hard Candy                             4.  In the morning, mix first dose of MoviPrep solution:    Empty 1 Pouch A and 1 Pouch B into the disposable container    Add lukewarm drinking water to the top line of the container. Mix to dissolve    Refrigerate (mixed solution should be used within 24 hrs)  5.  Begin drinking the prep at 5:00 p.m. The MoviPrep container is divided by 4 marks.   Every 15 minutes drink the solution down to the next mark (approximately 8 oz) until the full liter is complete.   6.  Follow completed prep with 16 oz of clear liquid of your choice (Nothing red or purple).  Continue to drink  clear liquids until bedtime.  7.  Before going to bed, mix second dose of MoviPrep solution:    Empty 1 Pouch A and 1 Pouch B into the disposable container    Add lukewarm drinking water to the top line of the container. Mix to dissolve    Refrigerate  THE DAY OF YOUR PROCEDURE      DATE:10/29/09 DAY: WED  Beginning at 930 a.m. (5 hours before procedure):         1. Every 15 minutes, drink the solution down to the next mark (approx 8 oz) until the full liter is complete.  2. Follow completed prep with 16 oz. of clear liquid of your choice.    3. You may drink clear liquids until 1230 pm (2 HOURS BEFORE PROCEDURE).   MEDICATION INSTRUCTIONS  Unless otherwise instructed, you should take regular prescription medications with a small sip of water   as early as possible the morning of your procedure.           OTHER INSTRUCTIONS  You will need a responsible adult at least 61 years  of age to accompany you and drive you home.   This person must remain in the waiting room during your procedure.  Wear loose fitting clothing that is easily removed.  Leave jewelry and other valuables at home.  However, you may wish to bring a book to read or  an iPod/MP3 player to listen to music as you wait for your procedure to start.  Remove all body piercing jewelry and leave at home.  Total time from sign-in until discharge is approximately 2-3 hours.  You should go home directly after your procedure and rest.  You can resume normal activities the  day after your procedure.  The day of your procedure you should not:   Drive   Make legal decisions   Operate machinery   Drink alcohol   Return to work  You will receive specific instructions about eating, activities and medications before you leave.    The above instructions have been reviewed and explained to me by   _______________________    I fully understand and can verbalize these instructions  _____________________________ Date _________

## 2010-04-02 NOTE — Letter (Signed)
Summary: Appointment Reminder  [facility]  [address] [place], [place] [address]   Phone: [phone]  Fax: [phone]        March 14, 2009 MRN: [id number]    Kristine Strong [address] [place], [place]  [address]    Dear Ms. Klemann,   We have been unable to reach you by phone to schedule a follow up   appointment that was recommended for you by Dr. ____. It is very   important that we reach you to schedule an appointment. We hope that you  allow [place] to participate in your health care needs. Please contact us at  [phone] at your earliest convenience to schedule your appointment.     Sincerely,    Rachael Fee MD

## 2010-04-02 NOTE — Assessment & Plan Note (Signed)
Review of gastrointestinal problems: 1. Nonerosive GERD.  EGD January 2011 was normal. Significant lifestyle issues contributing to her chronic GERD symptoms including chronic alcohol abuse, cigarette smoking, high caffeine intake, laying down soon before going to bed. 2. Dyspepsia, GERD related?  ultrasound 2010 showed normal gallbladder. HIDA scan 2011 showed normal gallbladder ejection fraction.    History of Present Illness Visit Type: consult Primary GI MD: Rob Bunting MD Primary Provider: Herb Grays, MD  Requesting Provider: Herb Grays, MD  Chief Complaint: nausea History of Present Illness:     stopped smoking cigs 5 months ago.  Stopped all caffeine.  She drinks alcohol 2-3 beers a couple times a week.    She has been having nausea, RUQ pains for "forever", about 3 months.  She actually does not have nausea, she actually has a sick feeling in her stomch, queezy feeling.  She had labs last month, amylase was very mildly elevated but all the other liver tests, pancreatic enzymes were all normal.  CBC was normal.  Her weight has been steady overall.  HIDA scan recently was normal EF.  Has been taking once daily omeprazole 20mg  with pretty good response.           Current Medications (verified): 1)  Synthroid 125 Mcg Tabs (Levothyroxine Sodium) .Marland Kitchen.. 1 By Mouth Once Daily 2)  Vitamin D 400 Unit Caps (Cholecalciferol) .Marland Kitchen.. 1 By Mouth At Bedtime 3)  Losartan Potassium 50 Mg Tabs (Losartan Potassium) .Marland Kitchen.. 1 By Mouth Once Daily 4)  Omeprazole 20 Mg Cpdr (Omeprazole) .Marland Kitchen.. 1 By Mouth Once Daily 5)  Calcium Plus Vitamin D 500-50 Mg-Unit Caps (Calcium Carbonate-Vitamin D) .Marland Kitchen.. 1 By Mouth Once Daily 6)  Pepto-Bismol 262 Mg Chew (Bismuth Subsalicylate) .Marland Kitchen.. 1-3 Once Daily As Needed 7)  Tums 500 Mg Chew (Calcium Carbonate Antacid) .Marland Kitchen.. 1 By Mouth Once Daily-Qod 8)  Maalox Plus 225-200-25 Mg/57ml Susp (Alum & Mag Hydroxide-Simeth) .... 3-4 Times Daily 9)  Maalox 600 Mg Chew  (Calcium Carbonate Antacid) .... 3-4 Times Daily  Allergies (verified): No Known Drug Allergies  Vital Signs:  Patient profile:   60 year old female Height:      64 inches Weight:      130 pounds BMI:     22.40 BSA:     1.63 Pulse rate:   88 / minute Pulse rhythm:   regular BP sitting:   122 / 76  (left arm) Cuff size:   regular  Vitals Entered By: Ok Anis CMA (August 26, 2009 1:34 PM)  Physical Exam  Additional Exam:  Constitutional: generally well appearing Psychiatric: alert and oriented times 3 Abdomen: soft, non-tender, non-distended, normal bowel sounds    Impression & Recommendations:  Problem # 1:  dyspepsia I did not mention above but she is also had some loose stools recently. She drank several beers a day for 20-30 years raising the question of chronic pancreatitis. She had a mildly elevated amylase level however the rest of her liver and pancreatic enzymes were normal. She is never had a CT scan of the abdomen I think it is a reasonable first step. This would be to look for other causes of lower, mid abdominal pain as well as to check for signs of chronic pancreatitis. If she indeed does have chronic pancreatitis the best thing that she could do is to completely avoid any future alcohol intake. Perhaps starting pancreatic enzymes supplements will help as well.  Other Orders: TLB-Lipase (83690-LIPASE) TLB-Amylase (82150-AMYL) TLB-CMP (Comprehensive Metabolic Pnl) (80053-COMP) T-CA  [id number] [id number])  Patient Instructions: 1)  You will be scheduled for a CT scan of abdomen and pelvis with IV and oral contrast for abdominal pains. 2)  A copy of this information will be sent to Dr. Herb Grays. 3)  You will get lab test(s) done today (amylase, lipase, CA 19-9, cmet) 4)  The medication list was reviewed and reconciled.  All changed / newly prescribed medications were explained.  A complete medication list was provided to the patient / caregiver.  Appended  Document: Orders Update/CT    Clinical Lists Changes  Orders: Added new Referral order of CT Abdomen/Pelvis with Contrast (CT Abd/Pelvis w/con) - Signed

## 2010-04-02 NOTE — Progress Notes (Signed)
Summary: results  Phone Note Call from Patient Call back at Work Phone [phone]   Caller: Patient Call For: Christella Hartigan Reason for Call: Talk to Nurse, Lab or Test Results Summary of Call: Patient would like CT results done Wed. Initial call taken by: Tawni Levy,  August 29, 2009 11:45 AM  New Problems: NONSPECIFIC ABN FINDING RAD & OTH EXAM GI TRACT (ICD-793.4)   New Problems: NONSPECIFIC ABN FINDING RAD & OTH EXAM GI TRACT (ICD-793.4)  Appended Document: results pt. scheduled for ueus on 09/08/09 at 12 noon at [facility].She would like to talk to you about the CT.Results.  Appended Document: results I spoke with her on phone, she is understandably nervous about upcoming EUS and potential for neoplasm.

## 2010-04-02 NOTE — Progress Notes (Signed)
Summary: med request  Phone Note Call from Patient Call back at Home Phone [phone] Call back at Work Phone [phone]   Caller: Patient Call For: Dr. Christella Hartigan Reason for Call: Talk to Nurse Summary of Call: would like medsto aid in nausea and stomach pain... going out of town... [facility] [place] Initial call taken by: Vallarie Mare,  September 10, 2009 12:13 PM  Follow-up for Phone Call        pt will begin two times a day Nexium and would like something for the nausea, she is going out of town early in the morning.  Still having pain also the radiated down to her pelvic area. Follow-up by: Chales Abrahams CMA Duncan Dull),  September 10, 2009 1:48 PM  Additional Follow-up for Phone Call Additional follow up Details #1::        call in zofran 4mg  pills, she should take one pill every morning, disp 30 with 3 refills Additional Follow-up by: Rachael Fee MD,  September 11, 2009 7:38 AM    Additional Follow-up for Phone Call Additional follow up Details #2::    left message on machine for pt to pick med up at pharmacy or I can send at any pharmacy she wishes while out of town Follow-up by: Chales Abrahams CMA Duncan Dull),  September 11, 2009 8:00 AM  New/Updated Medications: NEXIUM 40 MG CPDR (ESOMEPRAZOLE MAGNESIUM) 1 by mouth two times a day ZOFRAN 4 MG TABS (ONDANSETRON HCL) 1 tab by mouth q am Prescriptions: ZOFRAN 4 MG TABS (ONDANSETRON HCL) 1 tab by mouth q am  #30 x 3   Entered by:   Chales Abrahams CMA (AAMA)   Authorized by:   Rachael Fee MD   Signed by:   Chales Abrahams CMA (AAMA) on 09/11/2009   Method used:   [facility]ically to        [facility]  [place] [address] (retail[facility]       [address]       [place], [place]  [address]       Ph: [phone] or [phone]       Fax: [phone]   RxID:   [id number] NEXIUM 40 MG CPDR (ESOMEPRAZOLE MAGNESIUM) 1 by mouth two times a day  #60 x 11   Entered by:   Chales Abrahams CMA (AAMA)   Authorized by:   Rachael Fee MD   Signed by:   Chales Abrahams CMA  (AAMA) on 09/11/2009   Method used:   [facility]        [facility]  [place] [address]       [address]       [place], [place]  [address]       Ph: [phone] or [phone]       Fax: [phone]   RxID:   [id number]

## 2010-04-02 NOTE — Assessment & Plan Note (Signed)
Summary: right side abd pain, increased bowel movements/pl         jacobs   History of Present Illness Visit Type: Follow-up Visit Primary GI MD: Rob Bunting MD Primary Provider: Valli Glance Requesting Provider: Valli Glance Chief Complaint: RUQ abdominal pain which is generalized  and changes in bowel habits. History of Present Illness:   Kristine Strong is followed by Dr. Christella Hartigan for history of GERD. Despite lifestyle changes she is still having intermittent reflux and pyrosis.Taking Omeprazole 20mg  daily, 20 minutes prior to breakfast. She quit smoking in January, drinks much less coffee, cut back dramatically on beer comsumption and doesn't lay down after eating.   In October 2010  patient went to ER for RUQ pain. An U/S was normal. She still has intermittent RUQ pain but over last few months has had daily diffuse abdominal pain associated with increased belching. She frequently sighs which makes abdomen feel better. She has frequent episodes of mild nausea..   Patient thinks all of her GI problems started around October. She used to have formed stools 1-2 times a day. She now has 3-4 BMs every morning or varying consistencies. No rectal bleeding.      GI Review of Systems    Reports abdominal pain and  nausea.     Location of  Abdominal pain: diffuse.    Denies acid reflux, belching, bloating, chest pain, dysphagia with liquids, dysphagia with solids, heartburn, loss of appetite, vomiting, vomiting blood, weight loss, and  weight gain.      Reports change in bowel habits.     Denies anal fissure, black tarry stools, constipation, diarrhea, diverticulosis, fecal incontinence, heme positive stool, hemorrhoids, irritable bowel syndrome, jaundice, light color stool, liver problems, rectal bleeding, and  rectal pain.    Current Medications (verified): 1)  Synthroid 125 Mcg Tabs (Levothyroxine Sodium) .Marland Kitchen.. 1 By Mouth Once Daily 2)  Vitamin D 400 Unit Caps (Cholecalciferol) .Marland Kitchen.. 1 By  Mouth At Bedtime 3)  Losartan Potassium 50 Mg Tabs (Losartan Potassium) .Marland Kitchen.. 1 By Mouth Once Daily 4)  Omeprazole 20 Mg Cpdr (Omeprazole) .Marland Kitchen.. 1 By Mouth Once Daily 5)  Calcium Plus Vitamin D 500-50 Mg-Unit Caps (Calcium Carbonate-Vitamin D) .Marland Kitchen.. 1 By Mouth Once Daily 6)  Pepto-Bismol 262 Mg Chew (Bismuth Subsalicylate) .Marland Kitchen.. 1-3 Once Daily As Needed 7)  Tums 500 Mg Chew (Calcium Carbonate Antacid) .Marland Kitchen.. 1 By Mouth Once Daily-Qod  Allergies (verified): No Known Drug Allergies  Past History:  Past Medical History: Hyperlipidemia Hypertension Hypothyroidism Osteopenia GERD  Past Surgical History: C-section 1984 Rt. Breast Biopsy  Family History: Reviewed history from 03/14/2009 and no changes required. no esophageal cancer Family History of Colon Cancer: PGM  Social History: Reviewed history from 03/14/2009 and no changes required. she is married, she has one daughter, she does not work, until the past one week she smokes a pack of cigarettes a day and drinks 5-6 beers a day. She still drinks 4-5 cups of coffee a day but used to drink a pot of coffee a day Quit smoking in January 2011,  Review of Systems       The patient complains of arthritis/joint pain and sleeping problems.  The patient denies allergy/sinus, anemia, anxiety-new, back pain, blood in urine, breast changes/lumps, change in vision, confusion, cough, coughing up blood, depression-new, fainting, fatigue, fever, headaches-new, hearing problems, heart murmur, heart rhythm changes, itching, muscle pains/cramps, night sweats, nosebleeds, shortness of breath, skin rash, sore throat, swelling of feet/legs, swollen lymph glands, thirst - excessive, urination -  excessive, urination changes/pain, urine leakage, vision changes, and voice change.    Vital Signs:  Patient profile:   60 year old female Height:      64 inches Weight:      131 pounds BMI:     22.57 Pulse rate:   68 / minute Pulse rhythm:   regular BP sitting:    110 / 70  (left arm)  Vitals Entered By: Milford Cage NCMA (August 04, 2009 2:36 PM)  Physical Exam  General:  Well developed, well nourished, no acute distress. Head:  Normocephalic and atraumatic. Eyes:  Conjunctiva pink, no icterus.  Neck:  no obvious masses  Lungs:  Clear throughout to auscultation. Heart:  Regular rate and rhythm; no murmurs, rubs,  or bruits. Abdomen:  Abdomen soft, nontender, nondistended. No obvious masses or hepatomegaly.Normal bowel sounds.  Msk:  Symmetrical with no gross deformities. Normal posture. Extremities:  No palmar erythema, no edema.  Neurologic:  Alert and  oriented x4;  grossly normal neurologically. Skin:  Intact without significant lesions or rashes. Cervical Nodes:  No significant cervical adenopathy. Psych:  Alert and cooperative. Normal mood and affect.   Impression & Recommendations:  Problem # 1:  ABDOMINAL PAIN -GENERALIZED (ICD-789.07) Assessment Deteriorated Intermittent RUQ pain, negative U/S October 2005. Over last few months (since she last saw Dr. Christella Hartigan in January) patient has developed several GI problems including diffuse abdominal pain, bloating, belching,  and nausea. Gets temporary relief with Pepto Bismol. She may have non-ulcer dyspepsia. Biliary dyskinesia is possible. She has had increased frequency of stools,  Celiac disease is a possibility. She had a normal EGD in January, and though she wasn't having all these problems then, repeat upper endoscopy would probably not add much (except for small bowel biopsies).  Continue daily PPI. Obtain gallbladder emptying study. Follow up with Dr. Christella Hartigan.   ATTENDING: Agree and would consider increasing PPI dosage pending HIDA and trial of align. Kristine Boop MD, [facility]  August 09, 2009 6:32 AM  Problem # 2:  FLATULENCE-GAS-BLOATING (ICD-787.3) Assessment: Deteriorated Trial of Align.  Other Orders: HIDA CCK (HIDA CCK)  Patient Instructions: 1)  Take 1 Align capsule daily x  14 days. Samples given. 2)  Keep appt on 08-26-09 with  Dr. Christella Hartigan.  3)  We scheduled the HidaScan CCK at [facility] on 08-12-09 at 8Am. 4)  Directions given. 5)  Copy sent to :Herb Grays, MD  6)  The medication list was reviewed and reconciled.  All changed / newly prescribed medications were explained.  A complete medication list was provided to the patient / caregiver.

## 2010-06-04 LAB — COMPREHENSIVE METABOLIC PANEL
ALT: 22 U/L (ref 0–35)
Alkaline Phosphatase: 53 U/L (ref 39–117)
BUN: 13 mg/dL (ref 6–23)
CO2: 26 mEq/L (ref 19–32)
Chloride: 100 mEq/L (ref 96–112)
GFR calc non Af Amer: 56 mL/min — ABNORMAL LOW (ref >60)
Glucose, Bld: 78 mg/dL (ref 70–99)
Potassium: 3.9 mEq/L (ref 3.5–5.1)
Sodium: 135 mEq/L (ref 135–145)
Total Bilirubin: 0.7 mg/dL (ref 0.3–1.2)
Total Protein: 6.6 g/dL (ref 6.0–8.3)

## 2010-06-04 LAB — CBC
Hemoglobin: 13 g/dL (ref 12.0–15.0)
Platelets: 274 10*3/uL (ref 150–400)
RBC: 3.93 MIL/uL (ref 3.87–5.11)

## 2010-06-04 LAB — URINALYSIS, ROUTINE W REFLEX MICROSCOPIC
Glucose, UA: NEGATIVE mg/dL
Ketones, ur: NEGATIVE mg/dL
Protein, ur: NEGATIVE mg/dL
Urobilinogen, UA: 0.2 mg/dL (ref 0.0–1.0)

## 2010-06-04 LAB — LIPASE, BLOOD: Lipase: 18 U/L (ref 11–59)

## 2010-06-04 LAB — DIFFERENTIAL
Basophils Absolute: 0.1 10*3/uL (ref 0.0–0.1)
Basophils Relative: 1 % (ref 0–1)
Eosinophils Absolute: 0.1 10*3/uL (ref 0.0–0.7)
Neutro Abs: 12.2 10*3/uL — ABNORMAL HIGH (ref 1.7–7.7)
Neutrophils Relative %: 76 % (ref 43–77)

## 2010-07-14 NOTE — Op Note (Signed)
NAME:  Kristine Strong, Kristine Strong                ACCOUNT NO.:  [id number]   MEDICAL RECORD NO.:  [id number]          PATIENT TYPE:  AMB   LOCATION:  DSC                          FACILITY:  [facility]   PHYSICIAN:  Adolph Pollack, M.D.DATE OF BIRTH:  05/17/1950   DATE OF PROCEDURE:  05/24/2007  DATE OF DISCHARGE:                               OPERATIVE REPORT   PREOPERATIVE DIAGNOSIS:  Abnormal microcalcifications right breast.   POSTOPERATIVE DIAGNOSIS:  Abnormal microcalcifications right breast.   PROCEDURE:  Right breast biopsy after needle localization.   SURGEON:  Adolph Pollack, M.D.   ANESTHESIA:  General plus 50% Marcaine for local.   INDICATIONS:  [name] is a 60 year old female who had a mammogram  demonstrating microcalcifications in the right breast.  It was felt that  he needed biopsy but they were so close to the chest wall that  stereotactic approach could not be taken.  She subsequently underwent a  needle localization this morning and now presents for breast biopsy  after needle localization.   TECHNIQUE:  She is seen in the holding area and the right breast marked  with my initials.  She was then brought to the operating room, placed  supine on the operating room and given a general anesthetic.  The wire  was located and cut and the remaining portion of the wire and the right  breast was sterilely prepped and draped.  In the medial portion and  superior medial portion of the breast, a curvilinear incision was made  through the skin and subcutaneous tissue.  I then raised a flap  laterally until I identified the wire and then brought the wire into the  wound.  Using electrocautery, I excised a cylinder of tissue around the  wire which went all the way down to the chest wall, in fact, the hook of  the wire was laying on the pectoralis major muscle.  I did a Faxitron of  this and a fair number of microcalcifications were noted but in speaking  with Dr. Jean Rosenthal, he felt  there may be some more calcifications  anteriorly.  I then excised more tissue that would be anterior to the  wire, repeated the Faxitron picture of this and he said that there were  more microcalcifications in the specimen.  He was satisfied that we had  the area of concern.   Following this, I inspected the wound and hemostasis was adequate.  I  anesthetized the skin and subcutaneous tissue with Marcaine solution.  From retraction there was one area of skin exfoliation in the lateral  aspect of the skin.  I then closed the wound in two layers.  The  subcutaneous tissue was approximated with 3-0 Vicryl suture.  The skin  was closed with a 4-0 Monocryl subcuticular stitch, followed by Steri-  Strips and a bulky dressing.   She tolerated the procedure well without any apparent complications and  was taken to the recovery in satisfactory condition.      Adolph Pollack, M.D.  Electronically Signed     TJR/MEDQ  D:  05/24/2007  T:  05/24/2007  Job:  [id number]   cc:   Anselmo Pickler, M.D.  Tammy R. Collins Scotland, M.D.

## 2010-07-17 NOTE — Op Note (Signed)
NAME:  Kristine Strong, Kristine Strong                ACCOUNT NO.:  [id number]   MEDICAL RECORD NO.:  [id number]          PATIENT TYPE:  AMB   LOCATION:  ENDO                         FACILITY:  [facility]   PHYSICIAN:  Georgiana Spinner, M.D.    DATE OF BIRTH:  1950/03/19   DATE OF PROCEDURE:  12/12/2003  DATE OF DISCHARGE:                                 OPERATIVE REPORT   PROCEDURE PERFORMED:  Colonoscopy.   ENDOSCOPIST:  Georgiana Spinner, M.D.   INDICATIONS FOR PROCEDURE:  Colon cancer screening.   ANESTHESIA:  Demerol 30 mg, Versed 3 mg.   DESCRIPTION OF PROCEDURE:  With the patient mildly sedated in the left  lateral decubitus position, the Olympus videoscopic colonoscope was inserted  in the rectum and passed under direct vision to the cecum, identified by the  ileocecal valve and appendiceal orifice, both of which were photographed.  From this point the colonoscope was slowly withdrawn taking circumferential  views of the colonic mucosa stopping only in the rectum which appeared  normal on direct and showed hemorrhoids on retroflex view.  The endoscope  was straightened and withdrawn.  The patient's vital signs and pulse  oximeter remained stable.  The patient tolerated the procedure well without  apparent complications.   FINDINGS:  Unremarkable examination.   PLAN:  Consider repeat examination in five to 10 years.       GMO/MEDQ  D:  12/12/2003  T:  12/12/2003  Job:  [id number]

## 2010-07-17 NOTE — Op Note (Signed)
NAME:  Kristine Strong, Kristine Strong                ACCOUNT NO.:  [id number]   MEDICAL RECORD NO.:  [id number]          PATIENT TYPE:  AMB   LOCATION:  ENDO                         FACILITY:  [facility]   PHYSICIAN:  Georgiana Spinner, M.D.    DATE OF BIRTH:  07-Jan-1951   DATE OF PROCEDURE:  12/12/2003  DATE OF DISCHARGE:                                 OPERATIVE REPORT   PROCEDURE PERFORMED:  Upper endoscopy.   ENDOSCOPIST:  Georgiana Spinner, M.D.   INDICATIONS FOR PROCEDURE:  Gastroesophageal reflux disease.   ANESTHESIA:  Demerol 70 mg, Versed 7 mg.   DESCRIPTION OF PROCEDURE:  With the patient mildly sedated in the left  lateral decubitus position, the Olympus videoscopic endoscope was inserted  in the mouth and passed under direct vision through the esophagus which  appeared normal into the stomach.  The fundus, body, antrum, duodenal bulb  and second portion of the duodenum all appeared normal.  From this point,  the endoscope was slowly withdrawn taking circumferential views of the  entire duodenal mucosa until the endoscope was pulled back into the stomach  and placed on retroflexion to view the stomach from below.  The endoscope  was then straightened and withdrawn taking circumferential views of the  remaining gastric and esophageal mucosa.  The patient's vital signs and  pulse oximeter remained stable.  The patient tolerated the procedure well  without apparent complications.   FINDINGS:  Unremarkable examination.   PLAN:  Proceed to colonoscopy.       GMO/MEDQ  D:  12/12/2003  T:  12/12/2003  Job:  [id number]

## 2010-07-20 ENCOUNTER — Telehealth: Payer: Self-pay | Admitting: Internal Medicine

## 2010-07-20 NOTE — Telephone Encounter (Signed)
C/O Blood pressure  issues- Dr. Collins Scotland increase patients med's  hydrochlorothiazide 12.5 mg.on since the end of April .  Losartan 100 mg. Started on last dec.

## 2010-07-20 NOTE — Telephone Encounter (Signed)
I called and spoke with the patient. She is scheduled for an office visit with Dr. Graciela Husbands on 08/11/10. She has not seen anyone in our office before. I explained to her since she has a blood pressure issue and Dr. Graciela Husbands has not seen her before, she needs to contact Dr. Alda Berthold office for this. She states her bp usually runs around 140-150's systolic at the "Y". Her reading today was 157/92. She take HCTZ and Losartan. I have explained to her since this has been going on for a while, I do not feel like we need to move her appointment up with Dr. Graciela Husbands. She is agreeable to speak with Dr. Collins Scotland.

## 2010-07-22 ENCOUNTER — Ambulatory Visit: Payer: Self-pay | Admitting: Cardiology

## 2010-07-23 ENCOUNTER — Ambulatory Visit: Payer: Self-pay | Admitting: Internal Medicine

## 2010-07-30 ENCOUNTER — Encounter: Payer: Self-pay | Admitting: Internal Medicine

## 2010-08-11 ENCOUNTER — Ambulatory Visit (INDEPENDENT_AMBULATORY_CARE_PROVIDER_SITE_OTHER): Payer: 59 | Admitting: Internal Medicine

## 2010-08-11 ENCOUNTER — Encounter: Payer: Self-pay | Admitting: Internal Medicine

## 2010-08-11 VITALS — BP 142/88 | HR 73 | Ht 64.0 in | Wt 130.8 lb

## 2010-08-11 DIAGNOSIS — I10 Essential (primary) hypertension: Secondary | ICD-10-CM

## 2010-08-11 MED ORDER — HYDROCHLOROTHIAZIDE 12.5 MG PO TABS: 12.5000 mg | ORAL_TABLET | Freq: Every day | ORAL | Status: DC

## 2010-08-11 MED ORDER — LABETALOL HCL 200 MG PO TABS: 200.0000 mg | ORAL_TABLET | Freq: Two times a day (BID) | ORAL | Status: DC

## 2010-08-11 MED ORDER — LISINOPRIL 20 MG PO TABS: 20.0000 mg | ORAL_TABLET | Freq: Every day | ORAL | Status: DC

## 2010-08-11 NOTE — Assessment & Plan Note (Signed)
She has a history of hypertension. This is been relatively poorly controlled. She does not recall previously having been on an ACE inhibitor. It seems to be a reasonable place to start. Alternatively labetalol might be useful. She has a history of some degree of exercise associated rapid heart rates. I'm not quite sure that we can predict which one will be most beneficial. She will take them in sequence and let us know what the effects of them are on her blood pressure.

## 2010-08-11 NOTE — Progress Notes (Signed)
HPI: Kristine Strong is a 60 y.o. female Seen at the request of Dr Collins Scotland because of hypertension.  Her history of hypertension dates back to her 60s. She presumably has had a workup for primary causes of hypertension. She does not have a history of snoring. She does have poor sleep. She does not recall her medication history. The first medication she can remember is Benicar. This worked but was changed because [employer] issues. She was then treated with Micardis and now more recently withCozaar and this has been relatively ineffective.  She underwent an ultrasound couple of years ago 2009 per cardiology demonstrating normal left ventricular function normal wall thicknesses and atrial dimensions. She has not had a problem with peripheral edema chest pain palpitations or syncope.   Current Outpatient Prescriptions  Medication Sig Dispense Refill  . Calcium Carbonate-Vitamin D (CALCIUM PLUS VITAMIN D) 500-50 MG-UNIT CAPS Take 1 capsule by mouth daily.        . hydrochlorothiazide (,MICROZIDE/HYDRODIURIL,) 12.5 MG capsule Take 12.5 mg by mouth 2 (two) times daily.        Marland Kitchen levothyroxine (SYNTHROID, LEVOTHROID) 125 MCG tablet Take 125 mcg by mouth daily.        Marland Kitchen losartan (COZAAR) 100 MG tablet Take 100 mg by mouth daily.        . methylcellulose (CITRUCEL) oral powder Take by mouth. Take every evening as directed.       . NON FORMULARY CALCIUM CITRATE 500MG  VITAMIN D 800 MG TABLETS. Take 1 tablet every morning and 1 1/2 every evening.       Marland Kitchen OMEPRAZOLE PO Take by mouth daily.        . promethazine (PHENERGAN) 25 MG tablet Take 25 mg by mouth daily as needed.        Marland Kitchen DISCONTD: sucralfate (CARAFATE) 1 GM/10ML suspension Take 1 g by mouth 2 (two) times daily.          No Known Allergies  Past Medical History  Diagnosis Date  . Hyperlipidemia   . Hypertension   . Hyperthyroidism   . Osteopenia   . GERD (gastroesophageal reflux disease)     Past Surgical History  Procedure Date   . Cesarean section 1984  . Breast biopsy     Right breast    Family History  Problem Relation Age of Onset  . Colon cancer Paternal Grandmother   . Esophageal cancer Neg Hx     History   Social History  . Marital Status: Married    Spouse Name: N/A    Number of Children: N/A  . Years of Education: N/A   Occupational History  . Not on file.   Social History Main Topics  . Smoking status: Former Smoker -- 0.5 packs/day    Types: Cigarettes    Quit date: 03/05/2009  . Smokeless tobacco: Never Used  . Alcohol Use: 16.8 oz/week    28 Cans of beer per week     rare caffeine  . Drug Use: No  . Sexually Active: Not on file   Other Topics Concern  . Not on file   Social History Narrative  . No narrative on file    Fourteen point review of systems was negative except as noted in HPI and PMH   PHYSICAL EXAMINATION  Blood pressure 142/88, pulse 73, height 5\' 4"  (1.626 m), weight 130 lb 12.8 oz (59.33 kg).   Well developed and nourished Caucasian female appearing her stated age of 40 in no acute  distress HENT normal Neck supple with JVP-flat Carotids brisk and full without bruits Back without scoliosis or kyphosis Clear Regular rate and rhythm, no murmurs or gallops Abd-soft with active BS without hepatomegaly or midline pulsation Femoral pulses 2+ distal pulses intact No Clubbing cyanosis edema Skin-warm and dry LN-neg submandibular and supraclavicular A & Oriented CN 3-12 normal  Grossly normal sensory and motor function Affect engaging . ECG demonstrated sinus rhythm at 73 12.16/0.10/0.43 and R. Prime in lead V1 and V2 evidence of mildly broad P waves

## 2010-08-11 NOTE — Patient Instructions (Addendum)
Your physician has recommended you make the following change in your medication:  1) Stop HCTZ 2) Stop Losartan 3) you have been given a prescription for Lisinopril 20mg  one tablet daily & Labetolol 200mg  one tablet by mouth twice daily. Please take one of these prescriptions at a time to see which one works the best for you.   Your physician wants you to follow-up in: 1 year. You will receive a reminder letter in the mail two months in advance. If you don't receive a letter, please call our office to schedule the follow-up appointment.

## 2010-09-10 ENCOUNTER — Encounter: Payer: Self-pay | Admitting: Internal Medicine

## 2010-09-29 ENCOUNTER — Telehealth: Payer: Self-pay | Admitting: Internal Medicine

## 2010-09-29 DIAGNOSIS — I10 Essential (primary) hypertension: Secondary | ICD-10-CM

## 2010-09-29 NOTE — Telephone Encounter (Signed)
Pt called and stated pharmacy called yesterday and they have not heard.  She needs Lisinipril refill.  [facility] [place]  Pt has 4-5 left.

## 2010-09-30 ENCOUNTER — Other Ambulatory Visit: Payer: Self-pay | Admitting: *Deleted

## 2010-09-30 DIAGNOSIS — I10 Essential (primary) hypertension: Secondary | ICD-10-CM

## 2010-09-30 MED ORDER — LISINOPRIL 20 MG PO TABS: 20.0000 mg | ORAL_TABLET | Freq: Every day | ORAL | Status: DC

## 2010-10-02 ENCOUNTER — Telehealth: Payer: Self-pay | Admitting: Internal Medicine

## 2010-10-02 DIAGNOSIS — I10 Essential (primary) hypertension: Secondary | ICD-10-CM

## 2010-10-02 MED ORDER — LISINOPRIL 20 MG PO TABS: 20.0000 mg | ORAL_TABLET | Freq: Every day | ORAL | Status: DC

## 2010-10-02 NOTE — Telephone Encounter (Signed)
Pt said she called earlier in the week to get this refill request called in and pharmacy has yet to receive refill request. Please call a refill request in.

## 2010-10-05 ENCOUNTER — Other Ambulatory Visit: Payer: Self-pay | Admitting: *Deleted

## 2010-10-05 DIAGNOSIS — I10 Essential (primary) hypertension: Secondary | ICD-10-CM

## 2010-10-05 MED ORDER — LABETALOL HCL 200 MG PO TABS: 200.0000 mg | ORAL_TABLET | Freq: Two times a day (BID) | ORAL | Status: DC

## 2010-10-05 NOTE — Telephone Encounter (Signed)
LMOM told pt that [facility] spoke with [facility] and her script has been ready for pick up since 8/1

## 2010-10-05 NOTE — Telephone Encounter (Signed)
Pt very upset this med still not called in, down to one pill, and needs it called in asap

## 2010-10-19 ENCOUNTER — Other Ambulatory Visit: Payer: Self-pay | Admitting: *Deleted

## 2010-10-19 DIAGNOSIS — I10 Essential (primary) hypertension: Secondary | ICD-10-CM

## 2010-10-19 MED ORDER — LABETALOL HCL 200 MG PO TABS: 200.0000 mg | ORAL_TABLET | Freq: Two times a day (BID) | ORAL | Status: DC

## 2010-11-16 ENCOUNTER — Other Ambulatory Visit: Payer: Self-pay | Admitting: Family Medicine

## 2010-11-16 DIAGNOSIS — Z1231 Encounter for screening mammogram for malignant neoplasm of breast: Secondary | ICD-10-CM

## 2010-11-23 LAB — COMPREHENSIVE METABOLIC PANEL
AST: 23
Albumin: 4.2
BUN: 12
Chloride: 98
Creatinine, Ser: 0.71
GFR calc Af Amer: >60
Potassium: 4.4
Total Protein: 6.8

## 2010-11-23 LAB — DIFFERENTIAL
Eosinophils Relative: 1
Lymphocytes Relative: 24
Monocytes Absolute: 0.6
Monocytes Relative: 7
Neutro Abs: 6.3

## 2010-11-23 LAB — CBC
HCT: 37.5
MCV: 93.8
Platelets: 244
RDW: 12.6
WBC: 9.4

## 2010-11-26 ENCOUNTER — Other Ambulatory Visit: Payer: Self-pay | Admitting: *Deleted

## 2010-11-26 DIAGNOSIS — I10 Essential (primary) hypertension: Secondary | ICD-10-CM

## 2010-11-26 MED ORDER — LISINOPRIL 20 MG PO TABS: 20.0000 mg | ORAL_TABLET | Freq: Every day | ORAL | Status: DC

## 2010-12-01 ENCOUNTER — Ambulatory Visit
Admission: RE | Admit: 2010-12-01 | Discharge: 2010-12-01 | Disposition: A | Discharge status: Home / Self Care | Payer: 59 | Source: Ambulatory Visit | Attending: Family Medicine | Admitting: Family Medicine

## 2010-12-01 DIAGNOSIS — Z1231 Encounter for screening mammogram for malignant neoplasm of breast: Secondary | ICD-10-CM

## 2011-04-28 ENCOUNTER — Telehealth: Payer: Self-pay | Admitting: Internal Medicine

## 2011-04-28 NOTE — Telephone Encounter (Signed)
Appt made to see Tereso Newcomer on 3/4.

## 2011-04-28 NOTE — Telephone Encounter (Signed)
New msg Pt said she is taking lisinopril 20 mg and wants to switch meds. She said she is gaining weight. Please call

## 2011-05-03 ENCOUNTER — Encounter: Payer: Self-pay | Admitting: Physician Assistant

## 2011-05-03 ENCOUNTER — Ambulatory Visit (INDEPENDENT_AMBULATORY_CARE_PROVIDER_SITE_OTHER): Payer: 59 | Admitting: Physician Assistant

## 2011-05-03 VITALS — BP 130/75 | HR 78 | Ht 64.0 in | Wt 141.4 lb

## 2011-05-03 DIAGNOSIS — I10 Essential (primary) hypertension: Secondary | ICD-10-CM

## 2011-05-03 DIAGNOSIS — R635 Abnormal weight gain: Secondary | ICD-10-CM

## 2011-05-03 NOTE — Patient Instructions (Signed)
Your physician recommends that you schedule a follow-up appointment as scheduled Your physician has recommended you make the following change in your medication: if you decide to try Norvasc please call if your BP is over 140/90 after taking it for a few weeks, if you stay with Lisinopril then follow up as normal.  Call if you have any problems

## 2011-05-03 NOTE — Progress Notes (Signed)
[address] [place], [place]  [address] Phone: [phone] Fax:  [phone]  Date:  05/03/2011   Name:  Kristine Strong       DOB:  Oct 30, 1950 MRN:  [id number]  PCP:  Dr. Collins Scotland Primary Cardiologist:  Dr. Sherryl Manges    History of Present Illness: Kristine Strong is a 61 y.o. female who presents for follow up on BP.  She saw Dr. Graciela Husbands 6/12 for evaluation of blood pressure.  Past medical history includes hypertension, hyperlipidemia, hypothyroidism and GERD.  Echocardiogram 11/09: EF 55-60%.  He stopped her HCTZ and losartan.  He gave her prescription for lisinopril 20 mg and labetalol 200 mg twice daily.  She was to try both to see which one worked best for her.  She has noted an 11 pound weight gain over the last several mos.  She denies chest pain, dyspnea, orthopnea, PND, edema.  She just finished a round of antibiotics for sinusitis.  She has had some headaches.  She denies changing her diet recently.  She had a recent CPE with her PCP and she was told her TSH was ok.  She attributes her weight gain to Lisinopril.  She shows me an information sheet she received from her pharmacy.  It states she should contact her doctor if she develops swelling or weight gain.  She has no h/o CHF.  Past Medical History  Diagnosis Date  . Hyperlipidemia   . Hypertension   . Hypothyroidism   . Osteopenia   . GERD (gastroesophageal reflux disease)     Current Outpatient Prescriptions  Medication Sig Dispense Refill  . cetirizine (ZYRTEC) 10 MG tablet Take 10 mg by mouth daily.      Marland Kitchen levothyroxine (SYNTHROID, LEVOTHROID) 125 MCG tablet Take 125 mcg by mouth daily.        Marland Kitchen lisinopril (PRINIVIL,ZESTRIL) 20 MG tablet Take 1 tablet (20 mg total) by mouth daily.  90 tablet  3  . methylcellulose (CITRUCEL) oral powder Take by mouth. Take every evening as directed.       . Nutritional Supplements (MELATONIN PO) Take by mouth at bedtime as needed.      Marland Kitchen OMEPRAZOLE PO Take by  mouth daily.          Allergies: No Known Allergies  History  Substance Use Topics  . Smoking status: Former Smoker -- 0.5 packs/day    Types: Cigarettes    Quit date: 03/05/2009  . Smokeless tobacco: Never Used  . Alcohol Use: 16.8 oz/week    28 Cans of beer per week     rare caffeine     ROS:  Please see the history of present illness.   All other systems reviewed and negative.   PHYSICAL EXAM: VS:  BP 130/75  Pulse 78  Ht 5\' 4"  (1.626 m)  Wt 141 lb 6.4 oz (64.139 kg)  BMI 24.27 kg/m2 Repeat BP by on left 130/80  Well nourished, well developed, in no acute distress HEENT: normal Neck: no JVD Endocrine: no thyromegaly Cardiac:  normal S1, S2; RRR; no murmur Lungs:  clear to auscultation bilaterally, no wheezing, rhonchi or rales Abd: soft, nontender, no hepatomegaly Ext: no edema Skin: warm and dry Neuro:  CNs 2-12 intact, no focal abnormalities noted  ASSESSMENT AND PLAN:  1. Hypertension  BP looks much better on Lisinopril.  She does not a mild cough.  Her insurance will not cover Benicar but she could tolerate this and she tells me her  BP was good on this medication.  She is not interested in using a BID medication like Labetalol because she is afraid she will miss doses.  Weight gain is not listed as a typical side effect to Lisinopril.  I believe the statement she showed me from her information sheet is likely provided for CHF patients and I explained to her why we worry about weight gain in these patients.  I would prefer she remain on the Lisinopril.  She has been advised to review her diet and make sure that nothing has changed that may explain weight gain.  I have also given her a Rx for Norvasc.  If she decides she wants to change medications, she can stop the Lisinopril and start Norvasc 5 mg QD.  She will contact us if her BP is > 140/90 after taking this for 2 weeks.  Otherwise, follow up with Dr. Sherryl Manges as directed.     2. Weight gain  As noted above.   Follow up with PCP for identification of other causes.       Signed, Tereso Newcomer, PA-C  3:28 PM 05/03/2011

## 2011-08-05 ENCOUNTER — Other Ambulatory Visit: Payer: Self-pay | Admitting: Gynecology

## 2011-08-26 ENCOUNTER — Encounter: Payer: Self-pay | Admitting: Internal Medicine

## 2011-08-26 ENCOUNTER — Ambulatory Visit (INDEPENDENT_AMBULATORY_CARE_PROVIDER_SITE_OTHER): Payer: 59 | Admitting: Internal Medicine

## 2011-08-26 VITALS — BP 136/74 | HR 67 | Ht 64.0 in | Wt 133.0 lb

## 2011-08-26 DIAGNOSIS — I10 Essential (primary) hypertension: Secondary | ICD-10-CM

## 2011-08-26 LAB — BASIC METABOLIC PANEL
BUN: 15 mg/dL (ref 6–23)
CO2: 26 mEq/L (ref 19–32)
Calcium: 9.3 mg/dL (ref 8.4–10.5)
Chloride: 103 mEq/L (ref 96–112)
Creatinine, Ser: 0.8 mg/dL (ref 0.4–1.2)
Glucose, Bld: 89 mg/dL (ref 70–99)

## 2011-08-26 MED ORDER — LISINOPRIL 20 MG PO TABS: 20.0000 mg | ORAL_TABLET | Freq: Every day | ORAL | Status: DC

## 2011-08-26 NOTE — Assessment & Plan Note (Signed)
Stable  Will check bmet and have her followup with Dr Lajuan Lines

## 2011-08-26 NOTE — Patient Instructions (Signed)
Your physician recommends that you continue on your current medications as directed. Please refer to the Current Medication list given to you today.  Your physician recommends that you have lab work today: bmp  We will see you back on an as needed basis.

## 2011-08-26 NOTE — Progress Notes (Signed)
  HPI  Kristine Strong is a 61 y.o. female Seen in followup for f hypertension dates back to her 74s.  She underwent an ultrasound couple of years ago 2009 per cardiology demonstrating normal left ventricular function normal wall thicknesses and atrial dimensions. She has not had a problem with peripheral edema chest pain palpitations or syncope.  We tried lisinopril and labetolol and she tried and gained weight  She saw SW to whom she complained of bid meds and labetolol was stopped, lisinopril continued and norvasec offered as an alternative  She has stuck with lisinopril and her bp is 130 range or so.  She has lost 10 obs by working out and is thinking of running 5k  Past Medical History  Diagnosis Date  . Hyperlipidemia   . Hypertension   . Hypothyroidism   . Osteopenia   . GERD (gastroesophageal reflux disease)     Past Surgical History  Procedure Date  . Cesarean section 1984  . Breast biopsy     Right breast    Current Outpatient Prescriptions  Medication Sig Dispense Refill  . cetirizine (ZYRTEC) 10 MG tablet Take 10 mg by mouth daily.      Marland Kitchen levothyroxine (SYNTHROID, LEVOTHROID) 125 MCG tablet Take 125 mcg by mouth daily.        Marland Kitchen lisinopril (PRINIVIL,ZESTRIL) 20 MG tablet Take 1 tablet (20 mg total) by mouth daily.  90 tablet  3  . Nutritional Supplements (MELATONIN PO) Take 3 mg by mouth at bedtime as needed.       Marland Kitchen OMEPRAZOLE PO Take 20 mg by mouth daily.       . methylcellulose (CITRUCEL) oral powder Take by mouth. Take every evening as directed.         No Known Allergies  Review of Systems negative except from HPI and PMH  Physical Exam BP 136/74  Pulse 67  Ht 5\' 4"  (1.626 m)  Wt 133 lb (60.328 kg)  BMI 22.83 kg/m2  Well developed and nourished in no acute distress HENT normal Neck supple with JVP-flat Clear Regular rate and rhythm, no murmurs or gallops Abd-soft with active BS No Clubbing cyanosis edema Skin-warm and dry A & Oriented  Grossly  normal sensory and motor function  ECG: Sinus Rhythm 67            Intervals  15/09/41  Axis 67     .Assessment and  Plan

## 2011-09-06 ENCOUNTER — Encounter: Payer: Self-pay | Admitting: *Deleted

## 2011-11-13 IMAGING — NM NM HEPATO W/GB/PHARM/[PERSON_NAME]
1 series · 6 of 6 positions shown · non-contrast
Comparison: None.

CLINICAL DATA: Right upper quadrant pain.

NUCLEAR MEDICINE HEPATOBILIARY IMAGING WITH GALLBLADDER EF
TECHNIQUE: Sequential images of the abdomen were obtained [DATE] minutes following intravenous administration of
radiopharmaceutical.  After slow intravenous infusion of 1.1 uCg
Cholecystokinin, gallbladder ejection fraction was determined.
Radiopharmaceutical:  5.0 mCi 0c-44m Choletec

[he hepatobiliary · 3.43mm/px · 6 of 46 frames shown]
[frame 4/46]
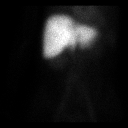
[frame 12/46]
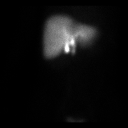
[frame 20/46]
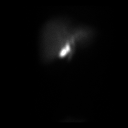
[frame 27/46]
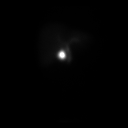
[frame 35/46]
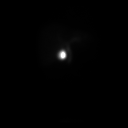
[frame 43/46]
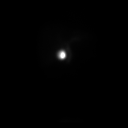

[6 of 6 positions shown; findings below may reference images not displayed]

FINDINGS: Homogeneous uptake by the liver.  Gallbladder visualized
by 15 minutes.  Small bowel not visualized within the first 50
minutes.

After injection of CCK, there is immediate visualization of small
bowel.  Calculated gallbladder ejection fraction is 92% (normal
value greater than or equal to 30%).

The patient did not experience symptoms during CCK infusion.
IMPRESSION: Normal gallbladder ejection fraction.

## 2011-12-01 ENCOUNTER — Other Ambulatory Visit: Payer: Self-pay | Admitting: Family Medicine

## 2011-12-01 DIAGNOSIS — Z1231 Encounter for screening mammogram for malignant neoplasm of breast: Secondary | ICD-10-CM

## 2011-12-28 ENCOUNTER — Ambulatory Visit
Admission: RE | Admit: 2011-12-28 | Discharge: 2011-12-28 | Disposition: A | Discharge status: Home / Self Care | Payer: 59 | Source: Ambulatory Visit | Attending: Family Medicine | Admitting: Family Medicine

## 2011-12-28 DIAGNOSIS — Z1231 Encounter for screening mammogram for malignant neoplasm of breast: Secondary | ICD-10-CM

## 2012-01-24 ENCOUNTER — Other Ambulatory Visit: Payer: Self-pay | Admitting: Dermatology

## 2012-02-08 ENCOUNTER — Ambulatory Visit: Payer: 59 | Admitting: Family Medicine

## 2012-03-14 ENCOUNTER — Encounter: Payer: Self-pay | Admitting: Family Medicine

## 2012-03-14 ENCOUNTER — Ambulatory Visit (INDEPENDENT_AMBULATORY_CARE_PROVIDER_SITE_OTHER): Payer: 59 | Admitting: Family Medicine

## 2012-03-14 VITALS — BP 122/82 | HR 80 | Temp 98.5°F | Ht 64.0 in | Wt 131.0 lb

## 2012-03-14 DIAGNOSIS — K219 Gastro-esophageal reflux disease without esophagitis: Secondary | ICD-10-CM

## 2012-03-14 DIAGNOSIS — F1010 Alcohol abuse, uncomplicated: Secondary | ICD-10-CM | POA: Insufficient documentation

## 2012-03-14 DIAGNOSIS — Z7189 Other specified counseling: Secondary | ICD-10-CM

## 2012-03-14 DIAGNOSIS — Z7689 Persons encountering health services in other specified circumstances: Secondary | ICD-10-CM

## 2012-03-14 DIAGNOSIS — I10 Essential (primary) hypertension: Secondary | ICD-10-CM

## 2012-03-14 DIAGNOSIS — E039 Hypothyroidism, unspecified: Secondary | ICD-10-CM | POA: Insufficient documentation

## 2012-03-14 DIAGNOSIS — E785 Hyperlipidemia, unspecified: Secondary | ICD-10-CM

## 2012-03-14 LAB — COMPREHENSIVE METABOLIC PANEL
Albumin: 4.3 g/dL (ref 3.5–5.2)
Alkaline Phosphatase: 74 U/L (ref 39–117)
BUN: 14 mg/dL (ref 6–23)
CO2: 29 mEq/L (ref 19–32)
GFR: 80.69 mL/min (ref >60.00)
Glucose, Bld: 92 mg/dL (ref 70–99)
Potassium: 4.3 mEq/L (ref 3.5–5.1)
Sodium: 139 mEq/L (ref 135–145)
Total Bilirubin: 0.6 mg/dL (ref 0.3–1.2)
Total Protein: 7.2 g/dL (ref 6.0–8.3)

## 2012-03-14 LAB — LIPID PANEL
Cholesterol: 263 mg/dL — ABNORMAL HIGH (ref 0–200)
HDL: 126.1 mg/dL (ref >39.00)
VLDL: 12.2 mg/dL (ref 0.0–40.0)

## 2012-03-14 LAB — VITAMIN B12: Vitamin B-12: 316 pg/mL (ref 211–911)

## 2012-03-14 LAB — TSH: TSH: 0.81 u[IU]/mL (ref 0.35–5.50)

## 2012-03-14 LAB — LDL CHOLESTEROL, DIRECT: Direct LDL: 106.4 mg/dL

## 2012-03-14 NOTE — Patient Instructions (Addendum)
-  We have ordered labs or studies at this visit. It can take up to 1-2 weeks for results and processing. We will contact you with instructions IF your results are abnormal. Normal results will be released to your [facility]. If you have not heard from [place] or can not find your results in [facility] in 2 weeks please contact our office.  -PLEASE SIGN UP FOR MYCHART TODAY   We recommend the following healthy lifestyle measures: - eat a healthy diet consisting of lots of vegetables, fruits, beans, nuts, seeds, healthy meats such as white chicken and fish and whole grains.  - avoid fried foods, fast food, processed foods, sodas, red meet and other fattening foods.  - get a least 150 minutes of aerobic exercise per week.   Follow up in: 2 months for regular follow up
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
## 2012-03-15 ENCOUNTER — Telehealth: Payer: Self-pay | Admitting: Family Medicine

## 2012-03-15 NOTE — Telephone Encounter (Signed)
Called and spoke with pt and pt is aware. Pt request labs be sent to home address.  Address verified and labs mailed.

## 2012-03-15 NOTE — Telephone Encounter (Signed)
Labs look good.

## 2012-03-17 ENCOUNTER — Encounter: Payer: Self-pay | Admitting: Family Medicine

## 2012-03-17 NOTE — Progress Notes (Signed)
Received a few EKG reports from 2009 from previous provider - will put in scan box.Also a CD - will have staff call patient to see if they want the CD as I do not have access to this.

## 2012-03-28 ENCOUNTER — Other Ambulatory Visit: Payer: Self-pay

## 2012-03-28 MED ORDER — OMEPRAZOLE 20 MG PO CPDR: 20.0000 mg | DELAYED_RELEASE_CAPSULE | Freq: Every day | ORAL | Status: DC

## 2012-03-28 MED ORDER — LEVOTHYROXINE SODIUM 125 MCG PO TABS: 125.0000 ug | ORAL_TABLET | Freq: Every day | ORAL | Status: DC

## 2012-03-28 NOTE — Telephone Encounter (Signed)
Pt called and requested rx of lisinopril, omeprazole, and synthroid. Rx sent to [facility].

## 2012-05-15 ENCOUNTER — Ambulatory Visit: Payer: 59 | Admitting: Family Medicine

## 2012-05-18 ENCOUNTER — Ambulatory Visit (INDEPENDENT_AMBULATORY_CARE_PROVIDER_SITE_OTHER): Payer: 59 | Admitting: Family Medicine

## 2012-05-18 ENCOUNTER — Encounter: Payer: Self-pay | Admitting: Family Medicine

## 2012-05-18 VITALS — BP 108/80 | HR 84 | Temp 99.2°F | Wt 135.0 lb

## 2012-05-18 DIAGNOSIS — Z2911 Encounter for prophylactic immunotherapy for respiratory syncytial virus (RSV): Secondary | ICD-10-CM

## 2012-05-18 DIAGNOSIS — K219 Gastro-esophageal reflux disease without esophagitis: Secondary | ICD-10-CM

## 2012-05-18 DIAGNOSIS — E785 Hyperlipidemia, unspecified: Secondary | ICD-10-CM

## 2012-05-18 DIAGNOSIS — I10 Essential (primary) hypertension: Secondary | ICD-10-CM

## 2012-05-18 DIAGNOSIS — F1010 Alcohol abuse, uncomplicated: Secondary | ICD-10-CM

## 2012-05-18 DIAGNOSIS — Z23 Encounter for immunization: Secondary | ICD-10-CM

## 2012-05-18 NOTE — Addendum Note (Signed)
Addended by: Aniceto Boss A on: 05/18/2012 12:48 PM   Modules accepted: Orders
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
## 2012-06-19 ENCOUNTER — Telehealth: Payer: Self-pay | Admitting: Family Medicine

## 2012-06-19 DIAGNOSIS — I10 Essential (primary) hypertension: Secondary | ICD-10-CM

## 2012-06-19 MED ORDER — LISINOPRIL 20 MG PO TABS: 20.0000 mg | ORAL_TABLET | Freq: Every day | ORAL | Status: DC

## 2012-06-19 NOTE — Telephone Encounter (Signed)
Pt needs refill on lisinopril 20 mg #90 with [facility]

## 2012-06-19 NOTE — Telephone Encounter (Signed)
Rx sent to pharmacy   

## 2012-12-30 ENCOUNTER — Other Ambulatory Visit: Payer: Self-pay | Admitting: Family Medicine

## 2013-02-18 ENCOUNTER — Other Ambulatory Visit: Payer: Self-pay | Admitting: Family Medicine

## 2013-03-05 ENCOUNTER — Other Ambulatory Visit: Payer: Self-pay

## 2013-03-05 DIAGNOSIS — Z1231 Encounter for screening mammogram for malignant neoplasm of breast: Secondary | ICD-10-CM

## 2013-03-10 ENCOUNTER — Other Ambulatory Visit: Payer: Self-pay | Admitting: Family Medicine

## 2013-03-22 ENCOUNTER — Ambulatory Visit: Payer: 59

## 2013-03-27 ENCOUNTER — Encounter: Payer: 59 | Admitting: Family Medicine

## 2013-03-28 ENCOUNTER — Other Ambulatory Visit: Payer: Self-pay | Admitting: Dermatology

## 2013-05-02 ENCOUNTER — Other Ambulatory Visit: Payer: Self-pay | Admitting: Family Medicine

## 2013-05-24 ENCOUNTER — Ambulatory Visit
Admission: RE | Admit: 2013-05-24 | Discharge: 2013-05-24 | Disposition: A | Discharge status: Home / Self Care | Payer: 59 | Source: Ambulatory Visit

## 2013-05-24 DIAGNOSIS — Z1231 Encounter for screening mammogram for malignant neoplasm of breast: Secondary | ICD-10-CM

## 2013-09-18 ENCOUNTER — Telehealth: Payer: Self-pay | Admitting: *Deleted

## 2013-09-18 NOTE — Telephone Encounter (Signed)
Have her in.

## 2013-09-18 NOTE — Telephone Encounter (Signed)
Issue with a Plantars Wart.  Can I get a prescription for the cream he prescribed last time?

## 2013-09-19 NOTE — Telephone Encounter (Signed)
I called and informed the patient that Dr. Milinda Pointer said he cannot prescribe the medication without seeing you.  She stated okay.  I transferred her to a scheduler.

## 2013-10-02 ENCOUNTER — Ambulatory Visit: Payer: Self-pay | Admitting: Podiatry

## 2013-10-16 ENCOUNTER — Ambulatory Visit: Payer: Self-pay | Admitting: Podiatry

## 2014-02-05 ENCOUNTER — Other Ambulatory Visit: Payer: Self-pay | Admitting: Dermatology

## 2014-07-04 ENCOUNTER — Encounter: Payer: Self-pay | Admitting: Gastroenterology

## 2014-11-14 ENCOUNTER — Other Ambulatory Visit: Payer: Self-pay

## 2014-11-14 DIAGNOSIS — Z1231 Encounter for screening mammogram for malignant neoplasm of breast: Secondary | ICD-10-CM

## 2014-12-18 ENCOUNTER — Ambulatory Visit
Admission: RE | Admit: 2014-12-18 | Discharge: 2014-12-18 | Disposition: A | Discharge status: Home / Self Care | Payer: Commercial Managed Care - HMO | Source: Ambulatory Visit

## 2014-12-18 DIAGNOSIS — Z1231 Encounter for screening mammogram for malignant neoplasm of breast: Secondary | ICD-10-CM

## 2015-01-07 DIAGNOSIS — L814 Other melanin hyperpigmentation: Secondary | ICD-10-CM | POA: Diagnosis not present

## 2015-01-07 DIAGNOSIS — D225 Melanocytic nevi of trunk: Secondary | ICD-10-CM | POA: Diagnosis not present

## 2015-01-07 DIAGNOSIS — D2272 Melanocytic nevi of left lower limb, including hip: Secondary | ICD-10-CM | POA: Diagnosis not present

## 2015-01-07 DIAGNOSIS — L853 Xerosis cutis: Secondary | ICD-10-CM | POA: Diagnosis not present

## 2015-01-07 DIAGNOSIS — Z85828 Personal history of other malignant neoplasm of skin: Secondary | ICD-10-CM | POA: Diagnosis not present

## 2015-01-07 DIAGNOSIS — L7211 Pilar cyst: Secondary | ICD-10-CM | POA: Diagnosis not present

## 2015-05-05 DIAGNOSIS — E0789 Other specified disorders of thyroid: Secondary | ICD-10-CM | POA: Diagnosis not present

## 2015-05-05 DIAGNOSIS — I10 Essential (primary) hypertension: Secondary | ICD-10-CM | POA: Diagnosis not present

## 2015-05-12 DIAGNOSIS — E032 Hypothyroidism due to medicaments and other exogenous substances: Secondary | ICD-10-CM | POA: Diagnosis not present

## 2015-05-12 DIAGNOSIS — K219 Gastro-esophageal reflux disease without esophagitis: Secondary | ICD-10-CM | POA: Diagnosis not present

## 2015-07-14 DIAGNOSIS — I10 Essential (primary) hypertension: Secondary | ICD-10-CM | POA: Diagnosis not present

## 2016-01-20 DIAGNOSIS — L570 Actinic keratosis: Secondary | ICD-10-CM | POA: Diagnosis not present

## 2016-01-27 DIAGNOSIS — Z23 Encounter for immunization: Secondary | ICD-10-CM | POA: Diagnosis not present

## 2016-03-22 DIAGNOSIS — D225 Melanocytic nevi of trunk: Secondary | ICD-10-CM | POA: Diagnosis not present

## 2016-03-22 DIAGNOSIS — L570 Actinic keratosis: Secondary | ICD-10-CM | POA: Diagnosis not present

## 2016-03-22 DIAGNOSIS — B0089 Other herpesviral infection: Secondary | ICD-10-CM | POA: Diagnosis not present

## 2016-03-29 ENCOUNTER — Other Ambulatory Visit: Payer: Self-pay | Admitting: Endocrinology

## 2016-03-29 DIAGNOSIS — Z1231 Encounter for screening mammogram for malignant neoplasm of breast: Secondary | ICD-10-CM

## 2016-04-09 ENCOUNTER — Ambulatory Visit
Admission: RE | Admit: 2016-04-09 | Discharge: 2016-04-09 | Disposition: A | Discharge status: Home / Self Care | Payer: Medicare Other | Source: Ambulatory Visit | Attending: Endocrinology | Admitting: Endocrinology

## 2016-04-09 DIAGNOSIS — Z1231 Encounter for screening mammogram for malignant neoplasm of breast: Secondary | ICD-10-CM

## 2016-05-05 DIAGNOSIS — E0789 Other specified disorders of thyroid: Secondary | ICD-10-CM | POA: Diagnosis not present

## 2016-05-05 DIAGNOSIS — I10 Essential (primary) hypertension: Secondary | ICD-10-CM | POA: Diagnosis not present

## 2016-05-12 DIAGNOSIS — I10 Essential (primary) hypertension: Secondary | ICD-10-CM | POA: Diagnosis not present

## 2016-05-12 DIAGNOSIS — Z23 Encounter for immunization: Secondary | ICD-10-CM | POA: Diagnosis not present

## 2016-05-12 DIAGNOSIS — N390 Urinary tract infection, site not specified: Secondary | ICD-10-CM | POA: Diagnosis not present

## 2016-05-12 DIAGNOSIS — E789 Disorder of lipoprotein metabolism, unspecified: Secondary | ICD-10-CM | POA: Diagnosis not present

## 2016-05-12 DIAGNOSIS — E032 Hypothyroidism due to medicaments and other exogenous substances: Secondary | ICD-10-CM | POA: Diagnosis not present

## 2016-12-10 DIAGNOSIS — Z23 Encounter for immunization: Secondary | ICD-10-CM | POA: Diagnosis not present

## 2017-01-11 DIAGNOSIS — H524 Presbyopia: Secondary | ICD-10-CM | POA: Diagnosis not present

## 2017-02-01 DIAGNOSIS — L570 Actinic keratosis: Secondary | ICD-10-CM | POA: Diagnosis not present

## 2017-02-01 DIAGNOSIS — L821 Other seborrheic keratosis: Secondary | ICD-10-CM | POA: Diagnosis not present

## 2017-02-01 DIAGNOSIS — L720 Epidermal cyst: Secondary | ICD-10-CM | POA: Diagnosis not present

## 2017-03-08 DIAGNOSIS — E789 Disorder of lipoprotein metabolism, unspecified: Secondary | ICD-10-CM | POA: Diagnosis not present

## 2017-03-08 DIAGNOSIS — J069 Acute upper respiratory infection, unspecified: Secondary | ICD-10-CM | POA: Diagnosis not present

## 2017-03-08 DIAGNOSIS — I10 Essential (primary) hypertension: Secondary | ICD-10-CM | POA: Diagnosis not present

## 2017-07-06 DIAGNOSIS — I10 Essential (primary) hypertension: Secondary | ICD-10-CM | POA: Diagnosis not present

## 2017-07-06 DIAGNOSIS — E789 Disorder of lipoprotein metabolism, unspecified: Secondary | ICD-10-CM | POA: Diagnosis not present

## 2017-07-06 DIAGNOSIS — Z0000 Encounter for general adult medical examination without abnormal findings: Secondary | ICD-10-CM | POA: Diagnosis not present

## 2017-07-06 DIAGNOSIS — E0789 Other specified disorders of thyroid: Secondary | ICD-10-CM | POA: Diagnosis not present

## 2017-07-13 DIAGNOSIS — I10 Essential (primary) hypertension: Secondary | ICD-10-CM | POA: Diagnosis not present

## 2017-07-13 DIAGNOSIS — E039 Hypothyroidism, unspecified: Secondary | ICD-10-CM | POA: Diagnosis not present

## 2017-07-13 DIAGNOSIS — Z780 Asymptomatic menopausal state: Secondary | ICD-10-CM | POA: Diagnosis not present

## 2017-07-13 DIAGNOSIS — M810 Age-related osteoporosis without current pathological fracture: Secondary | ICD-10-CM | POA: Diagnosis not present

## 2017-07-13 DIAGNOSIS — E782 Mixed hyperlipidemia: Secondary | ICD-10-CM | POA: Diagnosis not present

## 2017-07-13 DIAGNOSIS — K219 Gastro-esophageal reflux disease without esophagitis: Secondary | ICD-10-CM | POA: Diagnosis not present

## 2017-09-26 ENCOUNTER — Other Ambulatory Visit: Payer: Self-pay | Admitting: Internal Medicine

## 2017-09-26 DIAGNOSIS — Z1231 Encounter for screening mammogram for malignant neoplasm of breast: Secondary | ICD-10-CM

## 2017-10-19 ENCOUNTER — Ambulatory Visit
Admission: RE | Admit: 2017-10-19 | Discharge: 2017-10-19 | Disposition: A | Discharge status: Home / Self Care | Payer: Medicare Other | Source: Ambulatory Visit | Attending: Internal Medicine | Admitting: Internal Medicine

## 2017-10-19 DIAGNOSIS — Z1231 Encounter for screening mammogram for malignant neoplasm of breast: Secondary | ICD-10-CM

## 2017-12-30 DIAGNOSIS — Z23 Encounter for immunization: Secondary | ICD-10-CM | POA: Diagnosis not present

## 2018-02-27 DIAGNOSIS — Z23 Encounter for immunization: Secondary | ICD-10-CM | POA: Diagnosis not present

## 2018-04-25 DIAGNOSIS — L2089 Other atopic dermatitis: Secondary | ICD-10-CM | POA: Diagnosis not present

## 2018-04-25 DIAGNOSIS — L570 Actinic keratosis: Secondary | ICD-10-CM | POA: Diagnosis not present

## 2018-04-25 DIAGNOSIS — D2272 Melanocytic nevi of left lower limb, including hip: Secondary | ICD-10-CM | POA: Diagnosis not present

## 2018-04-25 DIAGNOSIS — L821 Other seborrheic keratosis: Secondary | ICD-10-CM | POA: Diagnosis not present

## 2018-04-25 DIAGNOSIS — D225 Melanocytic nevi of trunk: Secondary | ICD-10-CM | POA: Diagnosis not present

## 2018-04-25 DIAGNOSIS — L7211 Pilar cyst: Secondary | ICD-10-CM | POA: Diagnosis not present

## 2018-05-01 DIAGNOSIS — Z23 Encounter for immunization: Secondary | ICD-10-CM | POA: Diagnosis not present

## 2018-09-18 DIAGNOSIS — I10 Essential (primary) hypertension: Secondary | ICD-10-CM | POA: Diagnosis not present

## 2018-09-18 DIAGNOSIS — E789 Disorder of lipoprotein metabolism, unspecified: Secondary | ICD-10-CM | POA: Diagnosis not present

## 2018-09-18 DIAGNOSIS — E0789 Other specified disorders of thyroid: Secondary | ICD-10-CM | POA: Diagnosis not present

## 2018-09-22 DIAGNOSIS — I10 Essential (primary) hypertension: Secondary | ICD-10-CM | POA: Diagnosis not present

## 2018-09-22 DIAGNOSIS — Z7189 Other specified counseling: Secondary | ICD-10-CM | POA: Diagnosis not present

## 2018-09-22 DIAGNOSIS — E039 Hypothyroidism, unspecified: Secondary | ICD-10-CM | POA: Diagnosis not present

## 2018-09-22 DIAGNOSIS — E782 Mixed hyperlipidemia: Secondary | ICD-10-CM | POA: Diagnosis not present

## 2018-09-25 DIAGNOSIS — M79605 Pain in left leg: Secondary | ICD-10-CM | POA: Diagnosis not present

## 2018-09-25 DIAGNOSIS — R600 Localized edema: Secondary | ICD-10-CM | POA: Diagnosis not present

## 2018-09-25 DIAGNOSIS — Z7189 Other specified counseling: Secondary | ICD-10-CM | POA: Diagnosis not present

## 2018-09-29 ENCOUNTER — Other Ambulatory Visit: Payer: Self-pay

## 2018-11-14 DIAGNOSIS — Z23 Encounter for immunization: Secondary | ICD-10-CM | POA: Diagnosis not present

## 2018-11-28 ENCOUNTER — Other Ambulatory Visit: Payer: Self-pay | Admitting: Internal Medicine

## 2018-11-28 DIAGNOSIS — Z1231 Encounter for screening mammogram for malignant neoplasm of breast: Secondary | ICD-10-CM

## 2018-12-01 ENCOUNTER — Ambulatory Visit
Admission: RE | Admit: 2018-12-01 | Discharge: 2018-12-01 | Disposition: A | Discharge status: Home / Self Care | Payer: Medicare Other | Source: Ambulatory Visit | Attending: Internal Medicine | Admitting: Internal Medicine

## 2018-12-01 ENCOUNTER — Other Ambulatory Visit: Payer: Self-pay

## 2018-12-01 DIAGNOSIS — Z1231 Encounter for screening mammogram for malignant neoplasm of breast: Secondary | ICD-10-CM | POA: Diagnosis not present

## 2018-12-19 DIAGNOSIS — E039 Hypothyroidism, unspecified: Secondary | ICD-10-CM | POA: Diagnosis not present

## 2018-12-26 DIAGNOSIS — E782 Mixed hyperlipidemia: Secondary | ICD-10-CM | POA: Diagnosis not present

## 2018-12-26 DIAGNOSIS — I10 Essential (primary) hypertension: Secondary | ICD-10-CM | POA: Diagnosis not present

## 2018-12-26 DIAGNOSIS — E039 Hypothyroidism, unspecified: Secondary | ICD-10-CM | POA: Diagnosis not present

## 2019-03-22 ENCOUNTER — Ambulatory Visit: Payer: Medicare Other | Attending: Internal Medicine

## 2019-03-22 DIAGNOSIS — Z23 Encounter for immunization: Secondary | ICD-10-CM | POA: Insufficient documentation

## 2019-03-22 NOTE — Progress Notes (Signed)
   Covid-19 Vaccination Clinic  Name:  Kristine Strong    MRN: [id number] DOB: 05-02-1950  03/22/2019  [name] was observed post Covid-19 immunization for 15 minutes without incidence. She was provided with Vaccine Information Sheet and instruction to access the V-Safe system.   [name] was instructed to call 911 with any severe reactions post vaccine: Marland Kitchen Difficulty breathing  . Swelling of your face and throat  . A fast heartbeat  . A bad rash all over your body  . Dizziness and weakness    Immunizations Administered    Name Date Dose VIS Date Route   Pfizer COVID-19 Vaccine 03/22/2019  9:45 AM 0.3 mL 02/09/2019 Intramuscular   Manufacturer: [place]   Lot: [id number]   [place]: [id number]

## 2019-04-12 ENCOUNTER — Ambulatory Visit: Payer: Medicare Other | Attending: Internal Medicine

## 2019-04-12 DIAGNOSIS — Z23 Encounter for immunization: Secondary | ICD-10-CM

## 2019-04-12 NOTE — Progress Notes (Signed)
   Covid-19 Vaccination Clinic  Name:  Kristine Strong    MRN: [id number] DOB: October 30, 1950  04/12/2019  [name] was observed post Covid-19 immunization for 15 minutes without incidence. She was provided with Vaccine Information Sheet and instruction to access the V-Safe system.   [name] was instructed to call 911 with any severe reactions post vaccine: Marland Kitchen Difficulty breathing  . Swelling of your face and throat  . A fast heartbeat  . A bad rash all over your body  . Dizziness and weakness    Immunizations Administered    Name Date Dose VIS Date Route   Pfizer COVID-19 Vaccine 04/12/2019  9:46 AM 0.3 mL 02/09/2019 Intramuscular   Manufacturer: [place]   Lot: [id number]   [place]: [id number]

## 2019-05-08 DIAGNOSIS — H524 Presbyopia: Secondary | ICD-10-CM | POA: Diagnosis not present

## 2019-05-08 DIAGNOSIS — H04123 Dry eye syndrome of bilateral lacrimal glands: Secondary | ICD-10-CM | POA: Diagnosis not present

## 2019-05-08 DIAGNOSIS — H2513 Age-related nuclear cataract, bilateral: Secondary | ICD-10-CM | POA: Diagnosis not present

## 2019-06-11 DIAGNOSIS — B078 Other viral warts: Secondary | ICD-10-CM | POA: Diagnosis not present

## 2019-06-11 DIAGNOSIS — L570 Actinic keratosis: Secondary | ICD-10-CM | POA: Diagnosis not present

## 2019-06-11 DIAGNOSIS — D225 Melanocytic nevi of trunk: Secondary | ICD-10-CM | POA: Diagnosis not present

## 2019-06-11 DIAGNOSIS — L821 Other seborrheic keratosis: Secondary | ICD-10-CM | POA: Diagnosis not present

## 2019-06-20 DIAGNOSIS — E559 Vitamin D deficiency, unspecified: Secondary | ICD-10-CM | POA: Diagnosis not present

## 2019-06-20 DIAGNOSIS — E039 Hypothyroidism, unspecified: Secondary | ICD-10-CM | POA: Diagnosis not present

## 2019-06-20 DIAGNOSIS — I10 Essential (primary) hypertension: Secondary | ICD-10-CM | POA: Diagnosis not present

## 2019-06-20 DIAGNOSIS — M810 Age-related osteoporosis without current pathological fracture: Secondary | ICD-10-CM | POA: Diagnosis not present

## 2019-06-27 DIAGNOSIS — R1031 Right lower quadrant pain: Secondary | ICD-10-CM | POA: Diagnosis not present

## 2019-06-27 DIAGNOSIS — L659 Nonscarring hair loss, unspecified: Secondary | ICD-10-CM | POA: Diagnosis not present

## 2019-06-27 DIAGNOSIS — Z0000 Encounter for general adult medical examination without abnormal findings: Secondary | ICD-10-CM | POA: Diagnosis not present

## 2019-06-27 DIAGNOSIS — E782 Mixed hyperlipidemia: Secondary | ICD-10-CM | POA: Diagnosis not present

## 2019-06-27 DIAGNOSIS — E039 Hypothyroidism, unspecified: Secondary | ICD-10-CM | POA: Diagnosis not present

## 2019-06-27 DIAGNOSIS — R110 Nausea: Secondary | ICD-10-CM | POA: Diagnosis not present

## 2019-06-27 DIAGNOSIS — I10 Essential (primary) hypertension: Secondary | ICD-10-CM | POA: Diagnosis not present

## 2019-06-27 DIAGNOSIS — R109 Unspecified abdominal pain: Secondary | ICD-10-CM | POA: Diagnosis not present

## 2019-06-27 DIAGNOSIS — M810 Age-related osteoporosis without current pathological fracture: Secondary | ICD-10-CM | POA: Diagnosis not present

## 2019-06-27 DIAGNOSIS — K219 Gastro-esophageal reflux disease without esophagitis: Secondary | ICD-10-CM | POA: Diagnosis not present

## 2019-06-27 DIAGNOSIS — R1011 Right upper quadrant pain: Secondary | ICD-10-CM | POA: Diagnosis not present

## 2019-07-17 DIAGNOSIS — L650 Telogen effluvium: Secondary | ICD-10-CM | POA: Diagnosis not present

## 2019-07-17 DIAGNOSIS — L298 Other pruritus: Secondary | ICD-10-CM | POA: Diagnosis not present

## 2019-07-17 DIAGNOSIS — R1011 Right upper quadrant pain: Secondary | ICD-10-CM | POA: Diagnosis not present

## 2019-07-17 DIAGNOSIS — L7211 Pilar cyst: Secondary | ICD-10-CM | POA: Diagnosis not present

## 2019-07-19 DIAGNOSIS — K760 Fatty (change of) liver, not elsewhere classified: Secondary | ICD-10-CM | POA: Diagnosis not present

## 2019-07-19 DIAGNOSIS — R1011 Right upper quadrant pain: Secondary | ICD-10-CM | POA: Diagnosis not present

## 2019-07-19 DIAGNOSIS — N281 Cyst of kidney, acquired: Secondary | ICD-10-CM | POA: Diagnosis not present

## 2019-08-07 DIAGNOSIS — R1031 Right lower quadrant pain: Secondary | ICD-10-CM | POA: Diagnosis not present

## 2019-08-07 DIAGNOSIS — R1011 Right upper quadrant pain: Secondary | ICD-10-CM | POA: Diagnosis not present

## 2019-08-07 DIAGNOSIS — L659 Nonscarring hair loss, unspecified: Secondary | ICD-10-CM | POA: Diagnosis not present

## 2019-08-07 DIAGNOSIS — R748 Abnormal levels of other serum enzymes: Secondary | ICD-10-CM | POA: Diagnosis not present

## 2019-08-07 DIAGNOSIS — K219 Gastro-esophageal reflux disease without esophagitis: Secondary | ICD-10-CM | POA: Diagnosis not present

## 2019-08-09 ENCOUNTER — Telehealth: Payer: Self-pay | Admitting: Gastroenterology

## 2019-08-09 NOTE — Telephone Encounter (Signed)
Patient calling to schedule recall at the hospital

## 2019-08-10 NOTE — Telephone Encounter (Signed)
Spoke with patient, pt needing September appointment, no September schedule available at this time. Pt will call back in about 1 month to schedule, reminder in epic as well.

## 2019-08-10 NOTE — Telephone Encounter (Signed)
[facility] colonoscopy, I see no reason for EGD at same time.  Thanks

## 2019-08-10 NOTE — Telephone Encounter (Signed)
Spoke with patient regarding colon recall, patient states that she is willing to have procedure wherever is necessary, recall states [facility]. Pt wanting to know if she will need an endoscopy as well due to a history of polyps? Please advise, thank you.

## 2019-09-11 ENCOUNTER — Telehealth: Payer: Self-pay

## 2019-09-11 NOTE — Telephone Encounter (Signed)
Lm on vm for patient to return call to office so that she can get her [facility]-colon scheduled for September appt.

## 2019-09-11 NOTE — Telephone Encounter (Signed)
-----   Message from Yevette Edwards, RN sent at 08/10/2019 10:23 AM EDT ----- Recall colon in [place] , September appt

## 2019-09-12 ENCOUNTER — Encounter: Payer: Self-pay | Admitting: Gastroenterology

## 2019-09-12 DIAGNOSIS — R140 Abdominal distension (gaseous): Secondary | ICD-10-CM | POA: Diagnosis not present

## 2019-11-16 ENCOUNTER — Encounter: Payer: Medicare Other | Admitting: Gastroenterology

## 2019-11-28 DIAGNOSIS — Z23 Encounter for immunization: Secondary | ICD-10-CM | POA: Diagnosis not present

## 2019-12-06 ENCOUNTER — Other Ambulatory Visit: Payer: Self-pay | Admitting: Internal Medicine

## 2019-12-06 DIAGNOSIS — Z1231 Encounter for screening mammogram for malignant neoplasm of breast: Secondary | ICD-10-CM

## 2019-12-21 DIAGNOSIS — Z23 Encounter for immunization: Secondary | ICD-10-CM | POA: Diagnosis not present

## 2020-01-08 ENCOUNTER — Other Ambulatory Visit: Payer: Self-pay

## 2020-01-08 ENCOUNTER — Ambulatory Visit
Admission: RE | Admit: 2020-01-08 | Discharge: 2020-01-08 | Disposition: A | Discharge status: Home / Self Care | Payer: Medicare Other | Source: Ambulatory Visit | Attending: Internal Medicine | Admitting: Internal Medicine

## 2020-01-08 DIAGNOSIS — Z1231 Encounter for screening mammogram for malignant neoplasm of breast: Secondary | ICD-10-CM

## 2020-02-05 DIAGNOSIS — E782 Mixed hyperlipidemia: Secondary | ICD-10-CM | POA: Diagnosis not present

## 2020-02-05 DIAGNOSIS — I10 Essential (primary) hypertension: Secondary | ICD-10-CM | POA: Diagnosis not present

## 2020-02-11 DIAGNOSIS — K219 Gastro-esophageal reflux disease without esophagitis: Secondary | ICD-10-CM | POA: Diagnosis not present

## 2020-02-11 DIAGNOSIS — I10 Essential (primary) hypertension: Secondary | ICD-10-CM | POA: Diagnosis not present

## 2020-02-11 DIAGNOSIS — E039 Hypothyroidism, unspecified: Secondary | ICD-10-CM | POA: Diagnosis not present

## 2020-02-11 DIAGNOSIS — M810 Age-related osteoporosis without current pathological fracture: Secondary | ICD-10-CM | POA: Diagnosis not present

## 2020-02-11 DIAGNOSIS — E782 Mixed hyperlipidemia: Secondary | ICD-10-CM | POA: Diagnosis not present

## 2020-06-10 ENCOUNTER — Ambulatory Visit (AMBULATORY_SURGERY_CENTER): Payer: Self-pay | Admitting: *Deleted

## 2020-06-10 ENCOUNTER — Other Ambulatory Visit: Payer: Self-pay

## 2020-06-10 VITALS — Ht 64.0 in | Wt 140.0 lb

## 2020-06-10 DIAGNOSIS — Z1211 Encounter for screening for malignant neoplasm of colon: Secondary | ICD-10-CM

## 2020-06-10 NOTE — Progress Notes (Addendum)
No egg or soy allergy known to patient  No issues with past sedation with any surgeries or procedures Patient denies ever being told they had issues or difficulty with intubation  No FH of Malignant Hyperthermia No diet pills per patient No home 02 use per patient  No blood thinners per patient  Pt denies issues with constipation  No A fib or A flutter  EMMI video to pt or via [place] 19 guidelines implemented in [place] today with Pt and RN  Pt is fully vaccinated  for Covid      Due to the COVID-19 pandemic we are asking patients to follow certain guidelines.  Pt aware of COVID protocols and [place] guidelines    Patient reported severe GERD symptoms. Made appt for patient to be evaluated and  discuss adding EGD at time of colonoscopy. Appt 06/26/2020 @ 9:30 am  With Tye Savoy, PA.

## 2020-06-26 ENCOUNTER — Ambulatory Visit (INDEPENDENT_AMBULATORY_CARE_PROVIDER_SITE_OTHER): Payer: Medicare Other | Admitting: Nurse Practitioner

## 2020-06-26 ENCOUNTER — Encounter: Payer: Self-pay | Admitting: Nurse Practitioner

## 2020-06-26 ENCOUNTER — Other Ambulatory Visit: Payer: Self-pay

## 2020-06-26 VITALS — BP 142/74 | HR 73 | Ht 64.0 in | Wt 141.6 lb

## 2020-06-26 DIAGNOSIS — K219 Gastro-esophageal reflux disease without esophagitis: Secondary | ICD-10-CM | POA: Diagnosis not present

## 2020-06-26 DIAGNOSIS — Z1211 Encounter for screening for malignant neoplasm of colon: Secondary | ICD-10-CM

## 2020-06-26 DIAGNOSIS — R1310 Dysphagia, unspecified: Secondary | ICD-10-CM | POA: Diagnosis not present

## 2020-06-26 MED ORDER — OMEPRAZOLE 20 MG PO CPDR: 1.0000 | DELAYED_RELEASE_CAPSULE | Freq: Two times a day (BID) | ORAL | 3 refills | Status: DC
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
## 2020-06-26 NOTE — Patient Instructions (Signed)
If you are age 71 or older, your body mass index should be between 23-30. Your Body mass index is 24.31 kg/m. If this is out of the aforementioned range listed, please consider follow up with your Primary Care Provider.  If you are age 51 or younger, your body mass index should be between 19-25. Your Body mass index is 24.31 kg/m. If this is out of the aformentioned range listed, please consider follow up with your Primary Care Provider.   You have been scheduled for an endoscopy and colonoscopy. Please follow the written instructions given to you at your visit today. Please pick up your prep supplies at the pharmacy within the next 1-3 days. If you use inhalers (even only as needed), please bring them with you on the day of your procedure.

## 2020-06-27 NOTE — Progress Notes (Signed)
I agree with the above note, plan 

## 2020-06-30 ENCOUNTER — Encounter: Payer: Medicare Other | Admitting: Gastroenterology

## 2020-07-24 ENCOUNTER — Ambulatory Visit: Payer: Medicare Other | Attending: Internal Medicine

## 2020-07-24 ENCOUNTER — Other Ambulatory Visit (HOSPITAL_BASED_OUTPATIENT_CLINIC_OR_DEPARTMENT_OTHER): Payer: Self-pay

## 2020-07-24 ENCOUNTER — Other Ambulatory Visit: Payer: Self-pay

## 2020-07-24 DIAGNOSIS — Z23 Encounter for immunization: Secondary | ICD-10-CM

## 2020-07-24 MED ORDER — PFIZER-BIONT COVID-19 VAC-TRIS 30 MCG/0.3ML IM SUSP
INTRAMUSCULAR | 0 refills | Status: AC
  Filled 2020-07-24: qty 0.3, 1d supply, fill #0

## 2020-07-24 NOTE — Progress Notes (Signed)
   Covid-19 Vaccination Clinic  Name:  Kristine Strong    MRN: [id number] DOB: 1950/06/18  07/24/2020  [name] was observed post Covid-19 immunization for 15 minutes without incident. She was provided with Vaccine Information Sheet and instruction to access the V-Safe system.   [name] was instructed to call 911 with any severe reactions post vaccine: Marland Kitchen Difficulty breathing  . Swelling of face and throat  . A fast heartbeat  . A bad rash all over body  . Dizziness and weakness   Immunizations Administered    Name Date Dose VIS Date Route   PFIZER Comrnaty([place] TOP) Covid-19 Vaccine 07/24/2020  1:12 PM 0.3 mL 02/07/2020 Intramuscular   Manufacturer: [employer]   Lot: [id number]   [facility]: [phone]

## 2020-07-30 DIAGNOSIS — L720 Epidermal cyst: Secondary | ICD-10-CM | POA: Diagnosis not present

## 2020-07-30 DIAGNOSIS — D2272 Melanocytic nevi of left lower limb, including hip: Secondary | ICD-10-CM | POA: Diagnosis not present

## 2020-07-30 DIAGNOSIS — D2271 Melanocytic nevi of right lower limb, including hip: Secondary | ICD-10-CM | POA: Diagnosis not present

## 2020-07-30 DIAGNOSIS — D2262 Melanocytic nevi of left upper limb, including shoulder: Secondary | ICD-10-CM | POA: Diagnosis not present

## 2020-07-30 DIAGNOSIS — L821 Other seborrheic keratosis: Secondary | ICD-10-CM | POA: Diagnosis not present

## 2020-07-30 DIAGNOSIS — D225 Melanocytic nevi of trunk: Secondary | ICD-10-CM | POA: Diagnosis not present

## 2020-07-30 DIAGNOSIS — D2261 Melanocytic nevi of right upper limb, including shoulder: Secondary | ICD-10-CM | POA: Diagnosis not present

## 2020-08-07 DIAGNOSIS — E782 Mixed hyperlipidemia: Secondary | ICD-10-CM | POA: Diagnosis not present

## 2020-08-07 DIAGNOSIS — I10 Essential (primary) hypertension: Secondary | ICD-10-CM | POA: Diagnosis not present

## 2020-08-07 DIAGNOSIS — E039 Hypothyroidism, unspecified: Secondary | ICD-10-CM | POA: Diagnosis not present

## 2020-08-07 DIAGNOSIS — Z0000 Encounter for general adult medical examination without abnormal findings: Secondary | ICD-10-CM | POA: Diagnosis not present

## 2020-08-25 DIAGNOSIS — I10 Essential (primary) hypertension: Secondary | ICD-10-CM | POA: Diagnosis not present

## 2020-08-25 DIAGNOSIS — K219 Gastro-esophageal reflux disease without esophagitis: Secondary | ICD-10-CM | POA: Diagnosis not present

## 2020-08-25 DIAGNOSIS — E782 Mixed hyperlipidemia: Secondary | ICD-10-CM | POA: Diagnosis not present

## 2020-08-25 DIAGNOSIS — E039 Hypothyroidism, unspecified: Secondary | ICD-10-CM | POA: Diagnosis not present

## 2020-08-25 DIAGNOSIS — Z0000 Encounter for general adult medical examination without abnormal findings: Secondary | ICD-10-CM | POA: Diagnosis not present

## 2020-09-09 ENCOUNTER — Telehealth: Payer: Self-pay | Admitting: Nurse Practitioner

## 2020-09-09 NOTE — Telephone Encounter (Signed)
Inbound call from patient requesting a call back in the morning. Have questions about Endo procedure scheduled 7/15. Best contact number [phone]

## 2020-09-10 NOTE — Telephone Encounter (Signed)
Patient expressed concern over the egd part of her procedure scheduled for 7/15- she is worried about perforation if she needs to be dilated.  She stated she is not currently having trouble swallowing and is not sure if she wants a possible stricture addressed.  I told her I would pass on her concerns to see if you could address them.  I also encouraged her to speak to you about this when she comes for her procedure on Friday.

## 2020-09-10 NOTE — Telephone Encounter (Signed)
The risk of perforation is very low but not zero.  I will discuss this with her on Friday, certainly if she does not want to have the EGD or declines dilation we will respect her wishes.

## 2020-09-11 NOTE — Telephone Encounter (Signed)
Lm on vm 

## 2020-09-11 NOTE — Telephone Encounter (Signed)
Patient returned your call, please call patient one more time.   

## 2020-09-12 ENCOUNTER — Encounter: Payer: Self-pay | Admitting: Gastroenterology

## 2020-09-12 ENCOUNTER — Other Ambulatory Visit: Payer: Self-pay

## 2020-09-12 ENCOUNTER — Ambulatory Visit (AMBULATORY_SURGERY_CENTER): Payer: Medicare Other | Admitting: Gastroenterology

## 2020-09-12 VITALS — BP 133/75 | HR 66 | Temp 97.5°F | Resp 21 | Ht 64.0 in | Wt 141.0 lb

## 2020-09-12 DIAGNOSIS — K219 Gastro-esophageal reflux disease without esophagitis: Secondary | ICD-10-CM | POA: Diagnosis not present

## 2020-09-12 DIAGNOSIS — R1310 Dysphagia, unspecified: Secondary | ICD-10-CM

## 2020-09-12 DIAGNOSIS — Z1211 Encounter for screening for malignant neoplasm of colon: Secondary | ICD-10-CM | POA: Diagnosis not present

## 2020-09-12 DIAGNOSIS — K2100 Gastro-esophageal reflux disease with esophagitis, without bleeding: Secondary | ICD-10-CM

## 2020-09-12 MED ORDER — FAMOTIDINE 20 MG PO TABS: 40.0000 mg | ORAL_TABLET | Freq: Two times a day (BID) | ORAL | 3 refills | Status: DC

## 2020-09-12 MED ORDER — SODIUM CHLORIDE 0.9 % IV SOLN: 500.0000 mL | Freq: Once | INTRAVENOUS | Status: DC

## 2020-09-12 NOTE — Patient Instructions (Signed)
Resume previous diet Increase pepcid to 40mg  at bedtime every night. If still bothered by burning in stomach after 6 weeks of treatment, simply take dose twice daily; making sure you take only with water and at least 30 minutes before eating or drinking coffee, juice or beer. Repeat colonoscopy in 10 years  YOU HAD AN ENDOSCOPIC PROCEDURE TODAY AT [facility]:   Refer to the procedure report that was given to you for any specific questions about what was found during the examination.  If the procedure report does not answer your questions, please call your gastroenterologist to clarify.  If you requested that your care partner not be given the details of your procedure findings, then the procedure report has been included in a sealed envelope for you to review at your convenience later.  YOU SHOULD EXPECT: Some feelings of bloating in the abdomen. Passage of more gas than usual.  Walking can help get rid of the air that was put into your GI tract during the procedure and reduce the bloating. If you had a lower endoscopy (such as a colonoscopy or flexible sigmoidoscopy) you may notice spotting of blood in your stool or on the toilet paper. If you underwent a bowel prep for your procedure, you may not have a normal bowel movement for a few days.  Please Note:  You might notice some irritation and congestion in your nose or some drainage.  This is from the oxygen used during your procedure.  There is no need for concern and it should clear up in a day or so.  SYMPTOMS TO REPORT IMMEDIATELY:  Following lower endoscopy (colonoscopy or flexible sigmoidoscopy):  Excessive amounts of blood in the stool  Significant tenderness or worsening of abdominal pains  Swelling of the abdomen that is new, acute  Fever of 100F or higher  Following upper endoscopy (EGD)  Vomiting of blood or coffee ground material  New chest pain or pain under the shoulder blades  Painful or persistently difficult  swallowing  New shortness of breath  Fever of 100F or higher  Black, tarry-looking stools  For urgent or emergent issues, a gastroenterologist can be reached at any hour by calling [phone]. Do not use MyChart messaging for urgent concerns.    DIET:  We do recomend a small meal at first, but then you may proceed to your regular diet.  Drink plenty of fluids but you should avoid alcoholic beverages for 24 hours.  ACTIVITY:  You should plan to take it easy for the rest of today and you should NOT DRIVE or use heavy machinery until tomorrow (because of the sedation medicines used during the test).    FOLLOW UP: Our staff will call the number listed on your records 48-72 hours following your procedure to check on you and address any questions or concerns that you may have regarding the information given to you following your procedure. If we do not reach you, we will leave a message.  We will attempt to reach you two times.  During this call, we will ask if you have developed any symptoms of COVID 19. If you develop any symptoms (ie: fever, flu-like symptoms, shortness of breath, cough etc.) before then, please call [phone].  If you test positive for Covid 19 in the 2 weeks post procedure, please call and report this information to [place].    If any biopsies were taken you will be contacted by phone or by letter within the next 1-3 weeks.  Please call us at [phone] if you have not heard about the biopsies in 3 weeks.   SIGNATURES/CONFIDENTIALITY: You and/or your care partner have signed paperwork which will be entered into your electronic medical record.  These signatures attest to the fact that that the information above on your After Visit Summary has been reviewed and is understood.  Full responsibility of the confidentiality of this discharge information lies with you and/or your care-partner.

## 2020-09-12 NOTE — Progress Notes (Signed)
To PACU, VSS. Report to [email or web address] 

## 2020-09-12 NOTE — Telephone Encounter (Signed)
Spoke with patient and let her know what Dr. Ardis Hughs said.  Reiterated that he will talk with more today before her procedure.  Patient acknowledged and understood.

## 2020-09-12 NOTE — Op Note (Signed)
[place] Patient Name: Kristine Strong Procedure Date: 09/12/2020 1:20 PM MRN: [id number] Endoscopist: Milus Banister , MD Age: 70 Referring MD:  Date of Birth: Apr 13, 1950 Gender: Female Account #: [id number] Procedure:                Colonoscopy Indications:              Screening for colorectal malignant neoplasm Medicines:                Monitored Anesthesia Care Procedure:                Pre-Anesthesia Assessment:                           - Prior to the procedure, a History and Physical                            was performed, and patient medications and                            allergies were reviewed. The patient's tolerance of                            previous anesthesia was also reviewed. The risks                            and benefits of the procedure and the sedation                            options and risks were discussed with the patient.                            All questions were answered, and informed consent                            was obtained. Prior Anticoagulants: The patient has                            taken no previous anticoagulant or antiplatelet                            agents. ASA Grade Assessment: II - A patient with                            mild systemic disease. After reviewing the risks                            and benefits, the patient was deemed in                            satisfactory condition to undergo the procedure.                           After obtaining informed consent, the colonoscope  was passed under direct vision. Throughout the                            procedure, the patient's blood pressure, pulse, and                            oxygen saturations were monitored continuously. The                            GIF [id number] #[id number] was introduced through the anus                            and advanced to the the cecum, identified by                            appendiceal orifice  and ileocecal valve. The                            colonoscopy was performed without difficulty. The                            patient tolerated the procedure well. The quality                            of the bowel preparation was good. The ileocecal                            valve, appendiceal orifice, and rectum were                            photographed. Scope In: 1:34:45 PM Scope Out: 1:47:34 PM Scope Withdrawal Time: 0 hours 8 minutes 39 seconds  Total Procedure Duration: 0 hours 12 minutes 49 seconds  Findings:                 The entire examined colon appeared normal on direct                            and retroflexion views. Complications:            No immediate complications. Estimated blood loss:                            None. Estimated Blood Loss:     Estimated blood loss: none. Impression:               - The entire examined colon is normal on direct and                            retroflexion views.                           - No polyps or cancers. Recommendation:           - Repeat colonoscopy in 10 years for screening  purposes.                           - EGD now. Milus Banister, MD 09/12/2020 1:49:10 PM This report has been signed electronically.

## 2020-09-12 NOTE — Op Note (Signed)
[place] Patient Name: Kristine Strong Procedure Date: 09/12/2020 1:18 PM MRN: [id number] Endoscopist: Milus Banister , MD Age: 70 Referring MD:  Date of Birth: 23-Oct-1950 Gender: Female Account #: [id number] Procedure:                Upper GI endoscopy Indications:              Heartburn, dysphagia. She stopped PPI several weeks                            ago and has been taking H2 blocker at bedtime                            nightly for the past 3 weeks. Medicines:                Monitored Anesthesia Care Procedure:                Pre-Anesthesia Assessment:                           - Prior to the procedure, a History and Physical                            was performed, and patient medications and                            allergies were reviewed. The patient's tolerance of                            previous anesthesia was also reviewed. The risks                            and benefits of the procedure and the sedation                            options and risks were discussed with the patient.                            All questions were answered, and informed consent                            was obtained. Prior Anticoagulants: The patient has                            taken no previous anticoagulant or antiplatelet                            agents. ASA Grade Assessment: II - A patient with                            mild systemic disease. After reviewing the risks                            and benefits, the patient was deemed in  satisfactory condition to undergo the procedure.                           After obtaining informed consent, the endoscope was                            passed under direct vision. Throughout the                            procedure, the patient's blood pressure, pulse, and                            oxygen saturations were monitored continuously. The                            Olympus PCF-H190DL  [id number]) Colonoscope was                            introduced through the mouth, and advanced to the                            second part of duodenum. The upper GI endoscopy was                            accomplished without difficulty. The patient                            tolerated the procedure well. Scope In: Scope Out: Findings:                 LA Grade A (one or more mucosal breaks less than 5                            mm, not extending between tops of 2 mucosal folds)                            esophagitis was found in the lower third of the                            esophagus.                           The exam was otherwise without abnormality. Complications:            No immediate complications. Estimated blood loss:                            None. Estimated Blood Loss:     Estimated blood loss: none. Impression:               - LA Grade A reflux esophagitis. This acid damage                            is occuring despite H2 blocker pepcid once nightly.  You should increase your pepcid to 40mg  and take                            this at bedtime every night.                           - The examination was otherwise normal. Recommendation:           - Patient has a contact number available for                            emergencies. The signs and symptoms of potential                            delayed complications were discussed with the                            patient. Return to normal activities tomorrow.                            Written discharge instructions were provided to the                            patient.                           - Resume previous diet.                           - Increase pepcid to 40mg  at bedtime every night.                            If you are still bothered by heartburn, burning in                            stomach after several weeks at this dose you should                            start taking  it twice a day or change back to PPI. Milus Banister, MD 09/12/2020 1:57:37 PM This report has been signed electronically.

## 2020-09-12 NOTE — Progress Notes (Signed)
Pt's states no medical or surgical changes since previsit or office visit. 

## 2020-09-12 NOTE — Progress Notes (Signed)
C.W. vital signs. 

## 2020-09-16 ENCOUNTER — Telehealth: Payer: Self-pay | Admitting: *Deleted

## 2020-09-16 NOTE — Telephone Encounter (Signed)
  Follow up Call-  Call back number 09/12/2020  Post procedure Call Back phone  # [phone]  Permission to leave phone message Yes  Some recent data might be hidden   [facility]

## 2020-09-16 NOTE — Telephone Encounter (Signed)
Attempted 2nd f/u phone call. No answer. Left message.  °

## 2020-12-12 ENCOUNTER — Ambulatory Visit: Payer: Medicare Other | Attending: Internal Medicine

## 2020-12-12 ENCOUNTER — Other Ambulatory Visit (HOSPITAL_BASED_OUTPATIENT_CLINIC_OR_DEPARTMENT_OTHER): Payer: Self-pay

## 2020-12-12 DIAGNOSIS — Z23 Encounter for immunization: Secondary | ICD-10-CM

## 2020-12-12 MED ORDER — PFIZER COVID-19 VAC BIVALENT 30 MCG/0.3ML IM SUSP
INTRAMUSCULAR | 0 refills | Status: AC
  Filled 2020-12-12: qty 0.3, 1d supply, fill #0

## 2020-12-12 MED ORDER — INFLUENZA VAC A&B SA ADJ QUAD 0.5 ML IM PRSY
PREFILLED_SYRINGE | INTRAMUSCULAR | 0 refills | Status: AC
  Filled 2020-12-12: qty 0.5, 1d supply, fill #0

## 2020-12-12 NOTE — Progress Notes (Signed)
   Covid-19 Vaccination Clinic  Name:  Kristine Strong    MRN: [id number] DOB: [birth year]  12/12/2020  Ms. Real was observed post Covid-19 immunization for 15 minutes without incident. She was provided with Vaccine Information Sheet and instruction to access the V-Safe system.   [name] was instructed to call [phone] with any severe reactions post vaccine: Difficulty breathing  Swelling of face and throat  A fast heartbeat  A bad rash all over body  Dizziness and weakness

## 2020-12-31 ENCOUNTER — Other Ambulatory Visit: Payer: Self-pay | Admitting: Internal Medicine

## 2020-12-31 DIAGNOSIS — Z1231 Encounter for screening mammogram for malignant neoplasm of breast: Secondary | ICD-10-CM

## 2021-01-05 ENCOUNTER — Other Ambulatory Visit: Payer: Self-pay | Admitting: Gastroenterology

## 2021-02-06 ENCOUNTER — Ambulatory Visit
Admission: RE | Admit: 2021-02-06 | Discharge: 2021-02-06 | Disposition: A | Discharge status: Home / Self Care | Payer: Medicare Other | Source: Ambulatory Visit | Attending: Internal Medicine | Admitting: Internal Medicine

## 2021-02-06 DIAGNOSIS — Z1231 Encounter for screening mammogram for malignant neoplasm of breast: Secondary | ICD-10-CM | POA: Diagnosis not present

## 2021-04-14 DIAGNOSIS — E039 Hypothyroidism, unspecified: Secondary | ICD-10-CM | POA: Diagnosis not present

## 2021-04-14 DIAGNOSIS — E782 Mixed hyperlipidemia: Secondary | ICD-10-CM | POA: Diagnosis not present

## 2021-04-21 DIAGNOSIS — I10 Essential (primary) hypertension: Secondary | ICD-10-CM | POA: Diagnosis not present

## 2021-04-21 DIAGNOSIS — E039 Hypothyroidism, unspecified: Secondary | ICD-10-CM | POA: Diagnosis not present

## 2021-04-21 DIAGNOSIS — E782 Mixed hyperlipidemia: Secondary | ICD-10-CM | POA: Diagnosis not present

## 2021-07-08 DIAGNOSIS — D225 Melanocytic nevi of trunk: Secondary | ICD-10-CM | POA: Diagnosis not present

## 2021-07-08 DIAGNOSIS — L821 Other seborrheic keratosis: Secondary | ICD-10-CM | POA: Diagnosis not present

## 2021-07-08 DIAGNOSIS — L570 Actinic keratosis: Secondary | ICD-10-CM | POA: Diagnosis not present

## 2021-07-08 DIAGNOSIS — L905 Scar conditions and fibrosis of skin: Secondary | ICD-10-CM | POA: Diagnosis not present

## 2021-09-04 DIAGNOSIS — E039 Hypothyroidism, unspecified: Secondary | ICD-10-CM | POA: Diagnosis not present

## 2021-09-04 DIAGNOSIS — Z0000 Encounter for general adult medical examination without abnormal findings: Secondary | ICD-10-CM | POA: Diagnosis not present

## 2021-09-04 DIAGNOSIS — I10 Essential (primary) hypertension: Secondary | ICD-10-CM | POA: Diagnosis not present

## 2021-09-04 DIAGNOSIS — M810 Age-related osteoporosis without current pathological fracture: Secondary | ICD-10-CM | POA: Diagnosis not present

## 2021-09-04 DIAGNOSIS — E782 Mixed hyperlipidemia: Secondary | ICD-10-CM | POA: Diagnosis not present

## 2021-09-11 DIAGNOSIS — K219 Gastro-esophageal reflux disease without esophagitis: Secondary | ICD-10-CM | POA: Diagnosis not present

## 2021-09-11 DIAGNOSIS — I10 Essential (primary) hypertension: Secondary | ICD-10-CM | POA: Diagnosis not present

## 2021-09-11 DIAGNOSIS — E039 Hypothyroidism, unspecified: Secondary | ICD-10-CM | POA: Diagnosis not present

## 2021-09-11 DIAGNOSIS — E782 Mixed hyperlipidemia: Secondary | ICD-10-CM | POA: Diagnosis not present

## 2021-09-11 DIAGNOSIS — M810 Age-related osteoporosis without current pathological fracture: Secondary | ICD-10-CM | POA: Diagnosis not present

## 2021-09-30 DIAGNOSIS — M810 Age-related osteoporosis without current pathological fracture: Secondary | ICD-10-CM | POA: Diagnosis not present

## 2021-12-09 ENCOUNTER — Other Ambulatory Visit (HOSPITAL_BASED_OUTPATIENT_CLINIC_OR_DEPARTMENT_OTHER): Payer: Self-pay

## 2021-12-09 MED ORDER — COVID-19 MRNA 2023-2024 VACCINE (COMIRNATY) 0.3 ML INJECTION
INTRAMUSCULAR | 0 refills | Status: AC
  Filled 2021-12-09: qty 0.3, 1d supply, fill #0

## 2021-12-17 ENCOUNTER — Other Ambulatory Visit: Payer: Self-pay | Admitting: Gastroenterology

## 2021-12-17 NOTE — Telephone Encounter (Signed)
Good morning Dr Lyndel Safe,  This is a patient of Dr Ardis Hughs.  I am sending you the refill request as you are DOD am.  Please advise refills.

## 2021-12-28 ENCOUNTER — Other Ambulatory Visit (HOSPITAL_BASED_OUTPATIENT_CLINIC_OR_DEPARTMENT_OTHER): Payer: Self-pay

## 2021-12-28 MED ORDER — INFLUENZA VAC A&B SA ADJ QUAD 0.5 ML IM PRSY
PREFILLED_SYRINGE | INTRAMUSCULAR | 0 refills | Status: AC
  Filled 2021-12-28: qty 0.5, 1d supply, fill #0

## 2022-01-07 ENCOUNTER — Other Ambulatory Visit: Payer: Self-pay | Admitting: Internal Medicine

## 2022-01-07 DIAGNOSIS — Z1231 Encounter for screening mammogram for malignant neoplasm of breast: Secondary | ICD-10-CM

## 2022-02-26 ENCOUNTER — Other Ambulatory Visit (HOSPITAL_BASED_OUTPATIENT_CLINIC_OR_DEPARTMENT_OTHER): Payer: Self-pay

## 2022-02-26 MED ORDER — AREXVY 120 MCG/0.5ML IM SUSR
INTRAMUSCULAR | 0 refills | Status: DC
  Filled 2022-02-26: qty 0.5, 1d supply, fill #0

## 2022-03-11 ENCOUNTER — Ambulatory Visit
Admission: RE | Admit: 2022-03-11 | Discharge: 2022-03-11 | Disposition: A | Discharge status: Home / Self Care | Payer: Medicare Other | Source: Ambulatory Visit | Attending: Internal Medicine | Admitting: Internal Medicine

## 2022-03-11 DIAGNOSIS — Z1231 Encounter for screening mammogram for malignant neoplasm of breast: Secondary | ICD-10-CM

## 2022-03-12 DIAGNOSIS — E782 Mixed hyperlipidemia: Secondary | ICD-10-CM | POA: Diagnosis not present

## 2022-03-12 DIAGNOSIS — M810 Age-related osteoporosis without current pathological fracture: Secondary | ICD-10-CM | POA: Diagnosis not present

## 2022-03-12 DIAGNOSIS — I10 Essential (primary) hypertension: Secondary | ICD-10-CM | POA: Diagnosis not present

## 2022-03-12 DIAGNOSIS — E039 Hypothyroidism, unspecified: Secondary | ICD-10-CM | POA: Diagnosis not present

## 2022-03-19 DIAGNOSIS — I10 Essential (primary) hypertension: Secondary | ICD-10-CM | POA: Diagnosis not present

## 2022-03-19 DIAGNOSIS — Z23 Encounter for immunization: Secondary | ICD-10-CM | POA: Diagnosis not present

## 2022-03-19 DIAGNOSIS — E039 Hypothyroidism, unspecified: Secondary | ICD-10-CM | POA: Diagnosis not present

## 2022-03-19 DIAGNOSIS — K219 Gastro-esophageal reflux disease without esophagitis: Secondary | ICD-10-CM | POA: Diagnosis not present

## 2022-03-19 DIAGNOSIS — M810 Age-related osteoporosis without current pathological fracture: Secondary | ICD-10-CM | POA: Diagnosis not present

## 2022-03-19 DIAGNOSIS — E782 Mixed hyperlipidemia: Secondary | ICD-10-CM | POA: Diagnosis not present

## 2022-04-05 DIAGNOSIS — M810 Age-related osteoporosis without current pathological fracture: Secondary | ICD-10-CM | POA: Diagnosis not present

## 2022-07-12 DIAGNOSIS — L430 Hypertrophic lichen planus: Secondary | ICD-10-CM | POA: Diagnosis not present

## 2022-07-12 DIAGNOSIS — D485 Neoplasm of uncertain behavior of skin: Secondary | ICD-10-CM | POA: Diagnosis not present

## 2022-07-12 DIAGNOSIS — L723 Sebaceous cyst: Secondary | ICD-10-CM | POA: Diagnosis not present

## 2022-07-12 DIAGNOSIS — L821 Other seborrheic keratosis: Secondary | ICD-10-CM | POA: Diagnosis not present

## 2022-07-12 DIAGNOSIS — L298 Other pruritus: Secondary | ICD-10-CM | POA: Diagnosis not present

## 2022-09-20 DIAGNOSIS — R110 Nausea: Secondary | ICD-10-CM | POA: Diagnosis not present

## 2022-09-20 DIAGNOSIS — K5900 Constipation, unspecified: Secondary | ICD-10-CM | POA: Diagnosis not present

## 2022-09-20 DIAGNOSIS — R1013 Epigastric pain: Secondary | ICD-10-CM | POA: Diagnosis not present

## 2022-09-30 DIAGNOSIS — E039 Hypothyroidism, unspecified: Secondary | ICD-10-CM | POA: Diagnosis not present

## 2022-09-30 DIAGNOSIS — I10 Essential (primary) hypertension: Secondary | ICD-10-CM | POA: Diagnosis not present

## 2022-09-30 DIAGNOSIS — E782 Mixed hyperlipidemia: Secondary | ICD-10-CM | POA: Diagnosis not present

## 2022-09-30 DIAGNOSIS — Z0000 Encounter for general adult medical examination without abnormal findings: Secondary | ICD-10-CM | POA: Diagnosis not present

## 2022-09-30 DIAGNOSIS — K219 Gastro-esophageal reflux disease without esophagitis: Secondary | ICD-10-CM | POA: Diagnosis not present

## 2022-09-30 DIAGNOSIS — M810 Age-related osteoporosis without current pathological fracture: Secondary | ICD-10-CM | POA: Diagnosis not present

## 2022-10-13 DIAGNOSIS — E782 Mixed hyperlipidemia: Secondary | ICD-10-CM | POA: Diagnosis not present

## 2022-10-13 DIAGNOSIS — M810 Age-related osteoporosis without current pathological fracture: Secondary | ICD-10-CM | POA: Diagnosis not present

## 2022-10-13 DIAGNOSIS — K219 Gastro-esophageal reflux disease without esophagitis: Secondary | ICD-10-CM | POA: Diagnosis not present

## 2022-10-13 DIAGNOSIS — E039 Hypothyroidism, unspecified: Secondary | ICD-10-CM | POA: Diagnosis not present

## 2022-10-13 DIAGNOSIS — I10 Essential (primary) hypertension: Secondary | ICD-10-CM | POA: Diagnosis not present

## 2022-12-06 ENCOUNTER — Other Ambulatory Visit (HOSPITAL_BASED_OUTPATIENT_CLINIC_OR_DEPARTMENT_OTHER): Payer: Self-pay

## 2022-12-20 DIAGNOSIS — D692 Other nonthrombocytopenic purpura: Secondary | ICD-10-CM | POA: Diagnosis not present

## 2022-12-20 DIAGNOSIS — L720 Epidermal cyst: Secondary | ICD-10-CM | POA: Diagnosis not present

## 2022-12-20 DIAGNOSIS — L821 Other seborrheic keratosis: Secondary | ICD-10-CM | POA: Diagnosis not present

## 2022-12-20 DIAGNOSIS — L738 Other specified follicular disorders: Secondary | ICD-10-CM | POA: Diagnosis not present

## 2022-12-29 ENCOUNTER — Other Ambulatory Visit (HOSPITAL_BASED_OUTPATIENT_CLINIC_OR_DEPARTMENT_OTHER): Payer: Self-pay

## 2022-12-29 MED ORDER — FLUAD 0.5 ML IM SUSY
PREFILLED_SYRINGE | INTRAMUSCULAR | 0 refills | Status: AC
  Filled 2022-12-29: qty 0.5, 1d supply, fill #0

## 2023-01-07 ENCOUNTER — Other Ambulatory Visit (HOSPITAL_BASED_OUTPATIENT_CLINIC_OR_DEPARTMENT_OTHER): Payer: Self-pay

## 2023-01-07 MED ORDER — COMIRNATY 30 MCG/0.3ML IM SUSY
0.3000 mL | PREFILLED_SYRINGE | Freq: Once | INTRAMUSCULAR | 0 refills | Status: AC
  Filled 2023-01-07: qty 0.3, 1d supply, fill #0

## 2023-03-01 ENCOUNTER — Other Ambulatory Visit: Payer: Self-pay | Admitting: Internal Medicine

## 2023-03-01 DIAGNOSIS — Z1231 Encounter for screening mammogram for malignant neoplasm of breast: Secondary | ICD-10-CM

## 2023-03-18 ENCOUNTER — Ambulatory Visit
Admission: RE | Admit: 2023-03-18 | Discharge: 2023-03-18 | Disposition: A | Discharge status: Home / Self Care | Payer: Medicare Other | Source: Ambulatory Visit | Attending: Internal Medicine | Admitting: Internal Medicine

## 2023-03-18 DIAGNOSIS — Z1231 Encounter for screening mammogram for malignant neoplasm of breast: Secondary | ICD-10-CM

## 2023-04-25 DIAGNOSIS — M810 Age-related osteoporosis without current pathological fracture: Secondary | ICD-10-CM | POA: Diagnosis not present

## 2023-04-28 ENCOUNTER — Encounter: Payer: Self-pay | Admitting: Podiatry

## 2023-04-28 ENCOUNTER — Ambulatory Visit (INDEPENDENT_AMBULATORY_CARE_PROVIDER_SITE_OTHER): Payer: Medicare Other | Admitting: Podiatry

## 2023-04-28 DIAGNOSIS — D2372 Other benign neoplasm of skin of left lower limb, including hip: Secondary | ICD-10-CM | POA: Diagnosis not present
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
## 2023-05-10 IMAGING — MG MM DIGITAL SCREENING BILAT W/ TOMO AND CAD
8 of 14 series · 9 of 40 positions shown · non-contrast
Comparison: Previous exam(s).

CLINICAL DATA: Screening.

EXAM:
DIGITAL SCREENING BILATERAL MAMMOGRAM WITH TOMOSYNTHESIS AND CAD
TECHNIQUE: Bilateral screening digital craniocaudal and mediolateral oblique
mammograms were obtained. Bilateral screening digital breast
tomosynthesis was performed. The images were evaluated with
computer-aided detection.

[L CC synth-2D (1 of 2)]
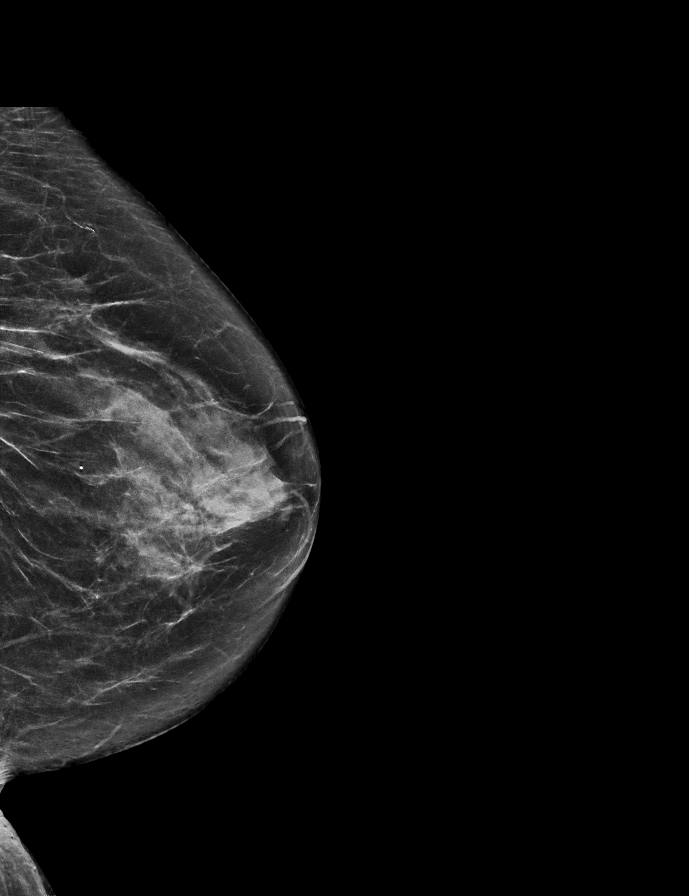

[L MLO synth-2D]
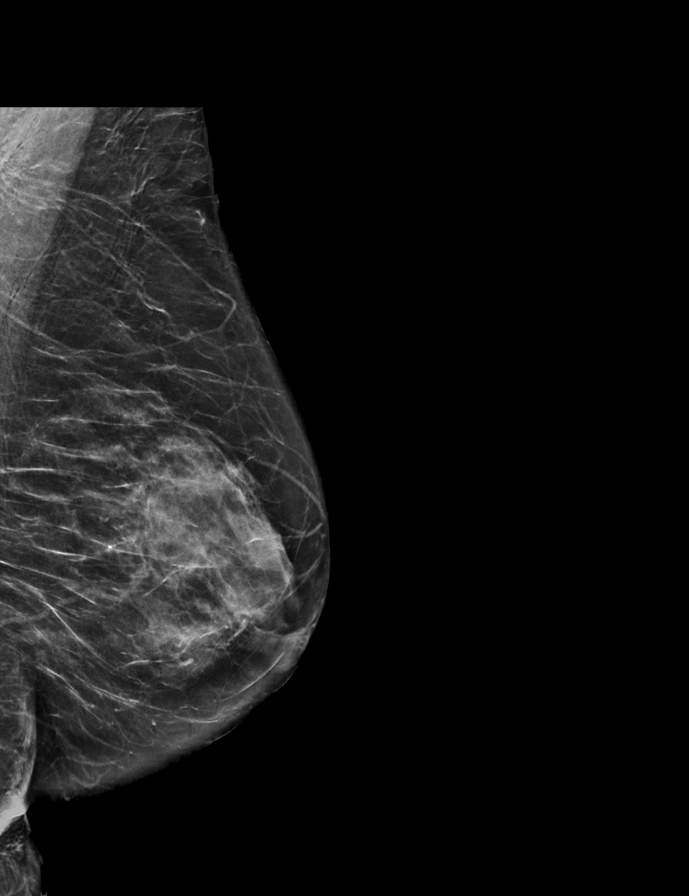

[R CC synth-2D (1 of 2)]
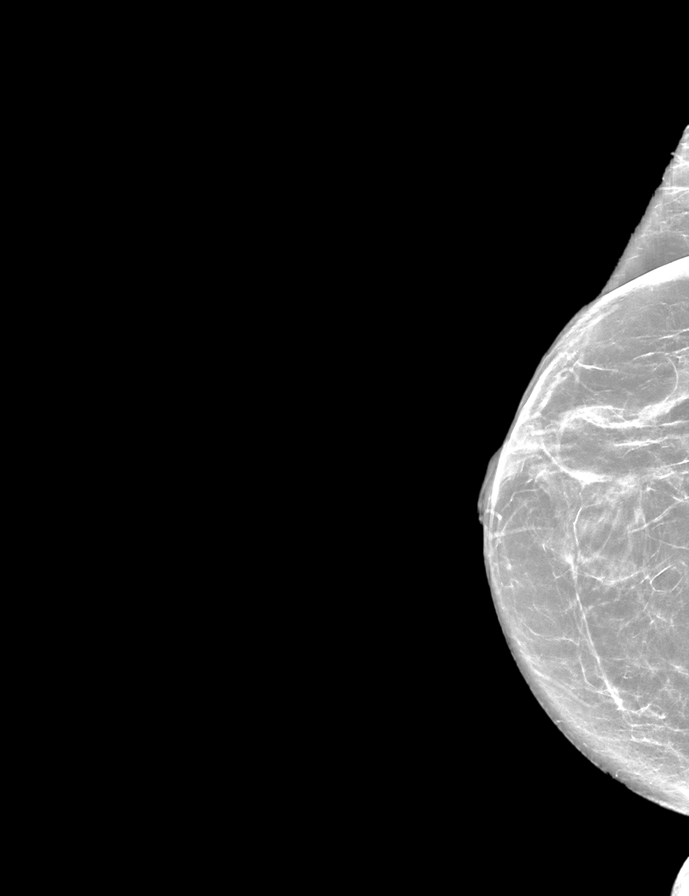

[R MLO synth-2D (1 of 2)]
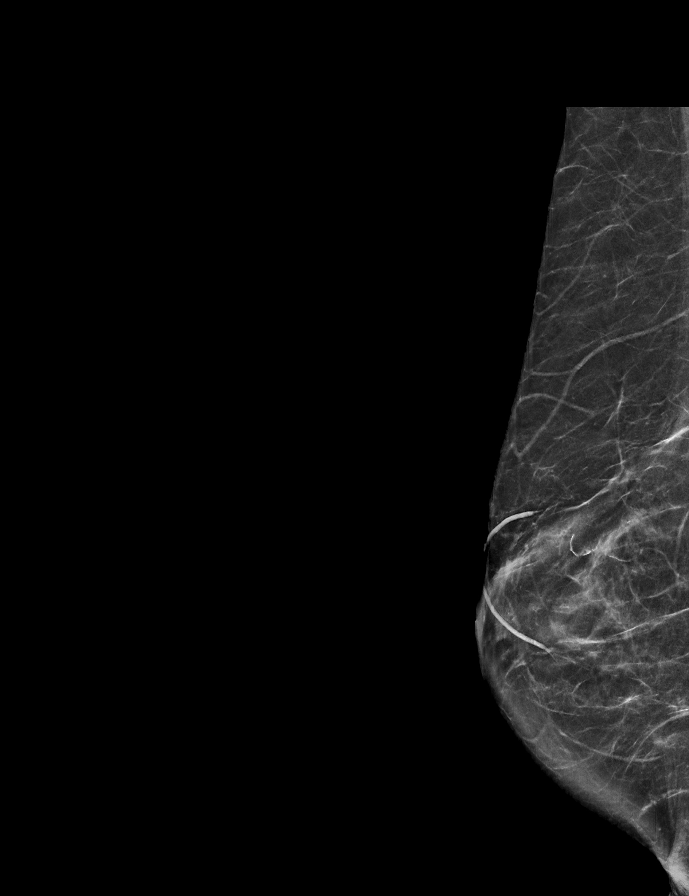

[R CC synth-2D (2 of 2)]
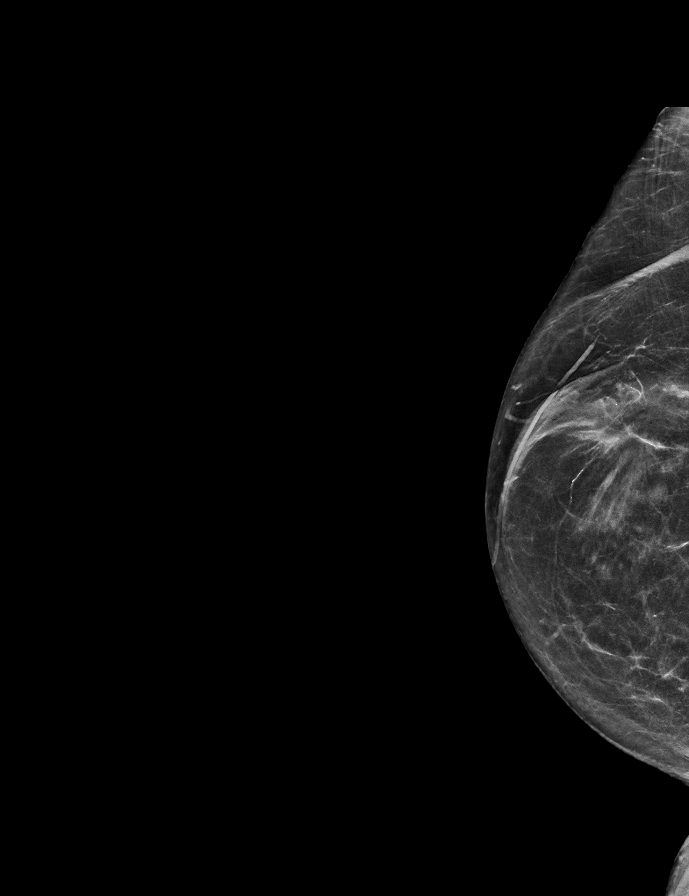

[R MLO synth-2D (2 of 2)]
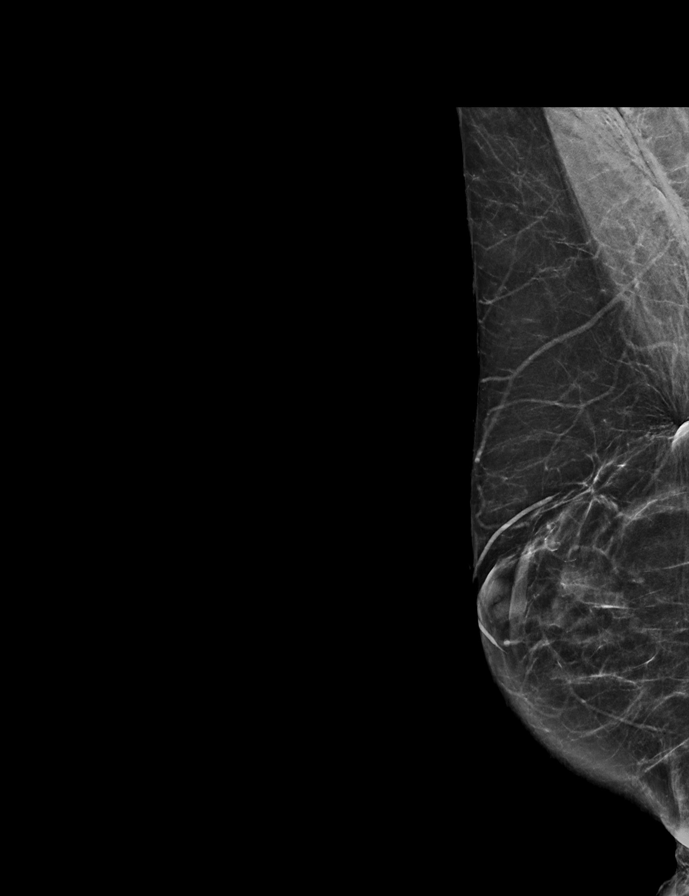

[L CC synth-2D (2 of 2)]
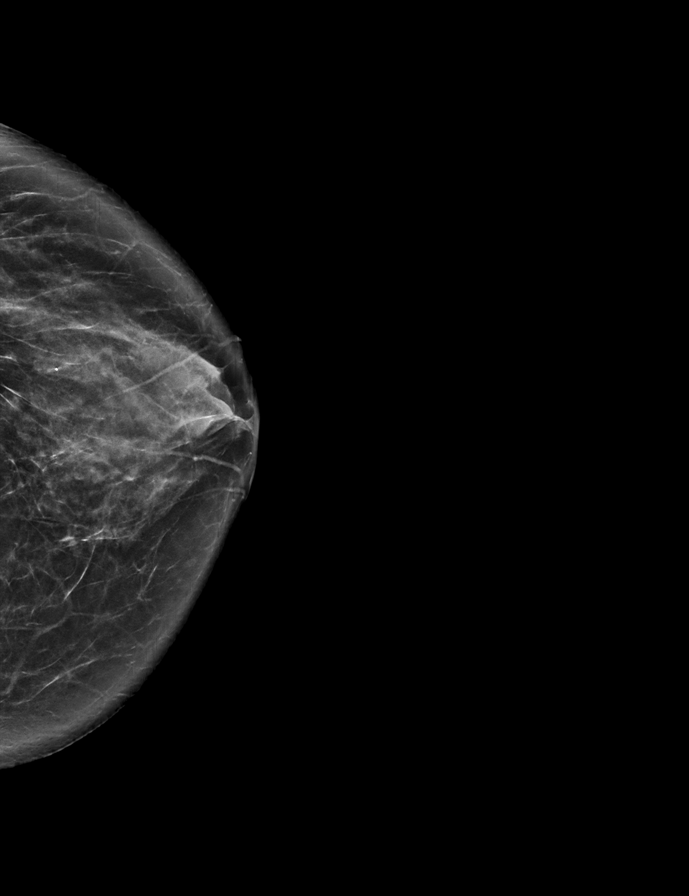

[R MLO tomo · 2 of 55 frames shown]
[frame 18/55]
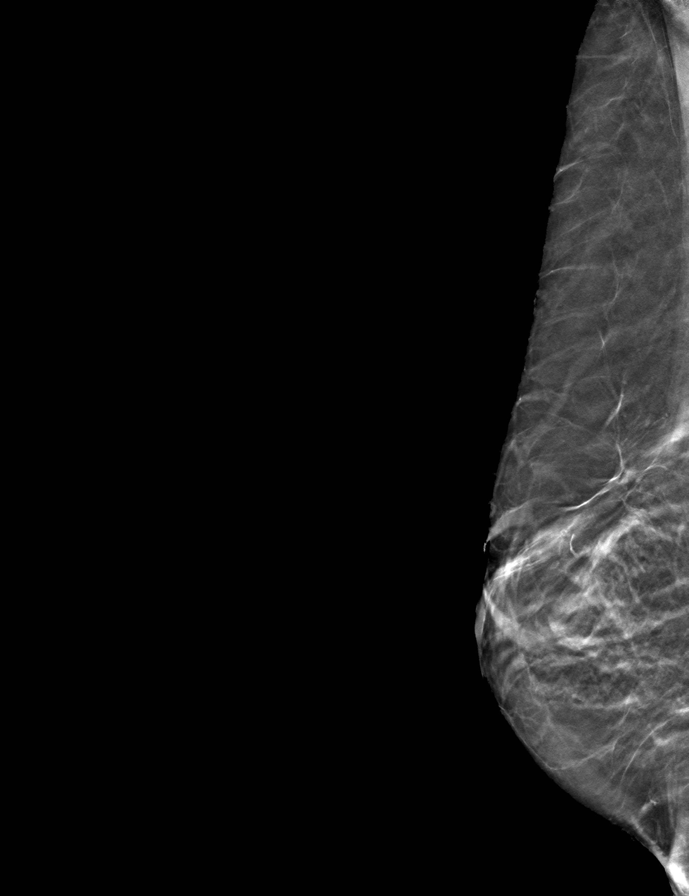
[frame 28/55]
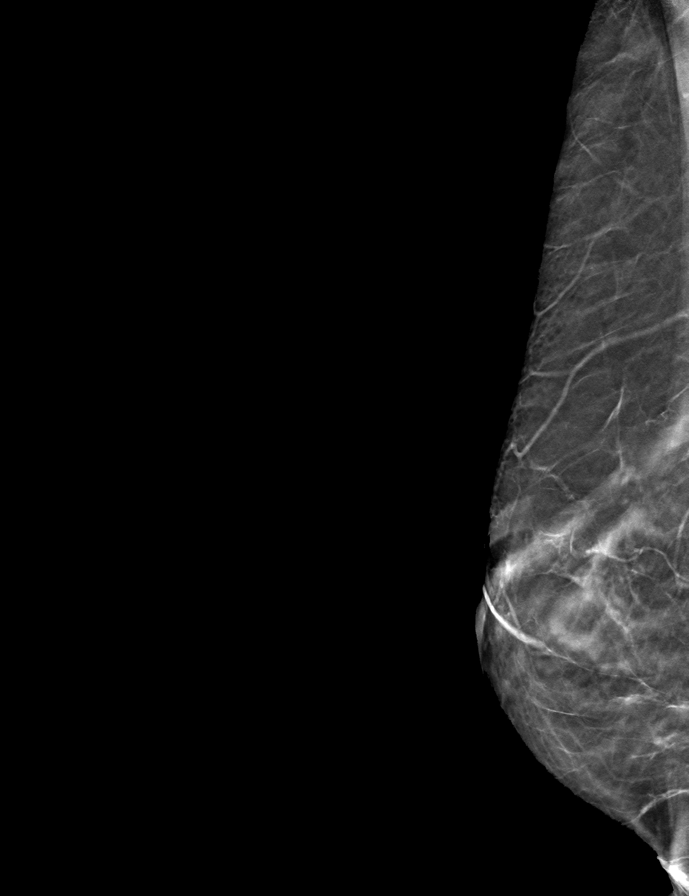

[9 of 40 positions shown; findings below may reference images not displayed]

ACR Breast Density Category c: The breast tissue is heterogeneously
dense, which may obscure small masses.
FINDINGS: There are no findings suspicious for malignancy.
IMPRESSION: No mammographic evidence of malignancy. A result letter of this
screening mammogram will be mailed directly to the patient.

RECOMMENDATION:
Screening mammogram in one year. (Code:Q3-W-BC3)

BI-RADS CATEGORY  1: Negative.

## 2023-11-08 DIAGNOSIS — E039 Hypothyroidism, unspecified: Secondary | ICD-10-CM | POA: Diagnosis not present

## 2023-11-08 DIAGNOSIS — I10 Essential (primary) hypertension: Secondary | ICD-10-CM | POA: Diagnosis not present

## 2023-11-08 DIAGNOSIS — M810 Age-related osteoporosis without current pathological fracture: Secondary | ICD-10-CM | POA: Diagnosis not present

## 2023-11-08 DIAGNOSIS — Z0000 Encounter for general adult medical examination without abnormal findings: Secondary | ICD-10-CM | POA: Diagnosis not present

## 2023-11-08 DIAGNOSIS — N390 Urinary tract infection, site not specified: Secondary | ICD-10-CM | POA: Diagnosis not present

## 2023-11-08 DIAGNOSIS — E782 Mixed hyperlipidemia: Secondary | ICD-10-CM | POA: Diagnosis not present

## 2023-11-15 DIAGNOSIS — E782 Mixed hyperlipidemia: Secondary | ICD-10-CM | POA: Diagnosis not present

## 2023-11-15 DIAGNOSIS — K219 Gastro-esophageal reflux disease without esophagitis: Secondary | ICD-10-CM | POA: Diagnosis not present

## 2023-11-15 DIAGNOSIS — E039 Hypothyroidism, unspecified: Secondary | ICD-10-CM | POA: Diagnosis not present

## 2023-11-15 DIAGNOSIS — Z0000 Encounter for general adult medical examination without abnormal findings: Secondary | ICD-10-CM | POA: Diagnosis not present

## 2023-11-15 DIAGNOSIS — I10 Essential (primary) hypertension: Secondary | ICD-10-CM | POA: Diagnosis not present

## 2023-11-15 DIAGNOSIS — M810 Age-related osteoporosis without current pathological fracture: Secondary | ICD-10-CM | POA: Diagnosis not present

## 2023-12-08 ENCOUNTER — Other Ambulatory Visit (HOSPITAL_BASED_OUTPATIENT_CLINIC_OR_DEPARTMENT_OTHER): Payer: Self-pay

## 2023-12-08 DIAGNOSIS — Z23 Encounter for immunization: Secondary | ICD-10-CM | POA: Diagnosis not present

## 2023-12-08 MED ORDER — COMIRNATY 30 MCG/0.3ML IM SUSY
0.3000 mL | PREFILLED_SYRINGE | Freq: Once | INTRAMUSCULAR | 0 refills | Status: AC
Start: 2023-12-08 — End: 2023-12-09
  Filled 2023-12-08: qty 0.3, 1d supply, fill #0

## 2023-12-08 MED ORDER — FLUZONE HIGH-DOSE 0.5 ML IM SUSY
0.5000 mL | PREFILLED_SYRINGE | Freq: Once | INTRAMUSCULAR | 0 refills | Status: AC
Start: 2023-12-08 — End: 2023-12-09
  Filled 2023-12-08: qty 0.5, 1d supply, fill #0

## 2024-02-15 ENCOUNTER — Other Ambulatory Visit: Payer: Self-pay | Admitting: Internal Medicine

## 2024-02-15 DIAGNOSIS — Z1231 Encounter for screening mammogram for malignant neoplasm of breast: Secondary | ICD-10-CM

## 2024-03-20 ENCOUNTER — Ambulatory Visit
Admission: RE | Admit: 2024-03-20 | Discharge: 2024-03-20 | Disposition: A | Source: Ambulatory Visit | Attending: Internal Medicine | Admitting: Internal Medicine

## 2024-03-20 DIAGNOSIS — Z1231 Encounter for screening mammogram for malignant neoplasm of breast: Secondary | ICD-10-CM

## 2024-04-19 ENCOUNTER — Ambulatory Visit: Admitting: Podiatry
